# Patient Record
Sex: Male | Born: 1940 | Race: White | Hispanic: No | Marital: Married | Smoking: Former smoker
Health system: Southern US, Community
[De-identification: ages and names within clinical notes are randomized; demographics above are authoritative.]

## PROBLEM LIST (undated history)

## (undated) DIAGNOSIS — I00 Rheumatic fever without heart involvement: Secondary | ICD-10-CM

## (undated) DIAGNOSIS — E119 Type 2 diabetes mellitus without complications: Secondary | ICD-10-CM

## (undated) DIAGNOSIS — J449 Chronic obstructive pulmonary disease, unspecified: Secondary | ICD-10-CM

## (undated) DIAGNOSIS — I4891 Unspecified atrial fibrillation: Secondary | ICD-10-CM

## (undated) DIAGNOSIS — I10 Essential (primary) hypertension: Secondary | ICD-10-CM

## (undated) HISTORY — PX: JOINT REPLACEMENT: SHX530

---

## 2015-10-28 ENCOUNTER — Emergency Department (HOSPITAL_COMMUNITY): Payer: Medicare Other

## 2015-10-28 ENCOUNTER — Inpatient Hospital Stay (HOSPITAL_COMMUNITY): Payer: Medicare Other

## 2015-10-28 ENCOUNTER — Encounter (HOSPITAL_COMMUNITY): Payer: Self-pay

## 2015-10-28 ENCOUNTER — Inpatient Hospital Stay (HOSPITAL_COMMUNITY)
Admission: EM | Admit: 2015-10-28 | Discharge: 2015-11-01 | DRG: 184 | Disposition: A | Discharge status: Skilled Nursing Facility | Payer: Medicare Other | Attending: Surgery | Admitting: Surgery

## 2015-10-28 DIAGNOSIS — K5900 Constipation, unspecified: Secondary | ICD-10-CM | POA: Diagnosis present

## 2015-10-28 DIAGNOSIS — Z7901 Long term (current) use of anticoagulants: Secondary | ICD-10-CM

## 2015-10-28 DIAGNOSIS — R402143 Coma scale, eyes open, spontaneous, at hospital admission: Secondary | ICD-10-CM | POA: Diagnosis present

## 2015-10-28 DIAGNOSIS — E871 Hypo-osmolality and hyponatremia: Secondary | ICD-10-CM | POA: Diagnosis present

## 2015-10-28 DIAGNOSIS — I4891 Unspecified atrial fibrillation: Secondary | ICD-10-CM | POA: Diagnosis present

## 2015-10-28 DIAGNOSIS — Z6841 Body Mass Index (BMI) 40.0 and over, adult: Secondary | ICD-10-CM | POA: Diagnosis not present

## 2015-10-28 DIAGNOSIS — M21371 Foot drop, right foot: Secondary | ICD-10-CM | POA: Diagnosis present

## 2015-10-28 DIAGNOSIS — R140 Abdominal distension (gaseous): Secondary | ICD-10-CM | POA: Diagnosis present

## 2015-10-28 DIAGNOSIS — J449 Chronic obstructive pulmonary disease, unspecified: Secondary | ICD-10-CM | POA: Diagnosis present

## 2015-10-28 DIAGNOSIS — S2241XA Multiple fractures of ribs, right side, initial encounter for closed fracture: Secondary | ICD-10-CM | POA: Diagnosis present

## 2015-10-28 DIAGNOSIS — Y9301 Activity, walking, marching and hiking: Secondary | ICD-10-CM | POA: Diagnosis present

## 2015-10-28 DIAGNOSIS — Z96653 Presence of artificial knee joint, bilateral: Secondary | ICD-10-CM | POA: Diagnosis present

## 2015-10-28 DIAGNOSIS — I10 Essential (primary) hypertension: Secondary | ICD-10-CM | POA: Diagnosis present

## 2015-10-28 DIAGNOSIS — E669 Obesity, unspecified: Secondary | ICD-10-CM | POA: Diagnosis present

## 2015-10-28 DIAGNOSIS — Z880 Allergy status to penicillin: Secondary | ICD-10-CM | POA: Diagnosis not present

## 2015-10-28 DIAGNOSIS — W010XXA Fall on same level from slipping, tripping and stumbling without subsequent striking against object, initial encounter: Secondary | ICD-10-CM | POA: Diagnosis present

## 2015-10-28 DIAGNOSIS — R402253 Coma scale, best verbal response, oriented, at hospital admission: Secondary | ICD-10-CM | POA: Diagnosis present

## 2015-10-28 DIAGNOSIS — Z87891 Personal history of nicotine dependence: Secondary | ICD-10-CM | POA: Diagnosis not present

## 2015-10-28 DIAGNOSIS — R402363 Coma scale, best motor response, obeys commands, at hospital admission: Secondary | ICD-10-CM | POA: Diagnosis present

## 2015-10-28 DIAGNOSIS — S272XXA Traumatic hemopneumothorax, initial encounter: Secondary | ICD-10-CM

## 2015-10-28 DIAGNOSIS — W19XXXA Unspecified fall, initial encounter: Secondary | ICD-10-CM

## 2015-10-28 DIAGNOSIS — Y92480 Sidewalk as the place of occurrence of the external cause: Secondary | ICD-10-CM | POA: Diagnosis not present

## 2015-10-28 DIAGNOSIS — E119 Type 2 diabetes mellitus without complications: Secondary | ICD-10-CM | POA: Diagnosis present

## 2015-10-28 HISTORY — DX: Essential (primary) hypertension: I10

## 2015-10-28 HISTORY — DX: Type 2 diabetes mellitus without complications: E11.9

## 2015-10-28 HISTORY — DX: Chronic obstructive pulmonary disease, unspecified: J44.9

## 2015-10-28 HISTORY — DX: Unspecified atrial fibrillation: I48.91

## 2015-10-28 HISTORY — DX: Rheumatic fever without heart involvement: I00

## 2015-10-28 LAB — CBC WITH DIFFERENTIAL/PLATELET
BASOS ABS: 0 10*3/uL (ref 0.0–0.1)
BASOS PCT: 0 %
EOS PCT: 0 %
Eosinophils Absolute: 0 10*3/uL (ref 0.0–0.7)
HCT: 46.8 % (ref 39.0–52.0)
Hemoglobin: 15.3 g/dL (ref 13.0–17.0)
Lymphocytes Relative: 8 %
Lymphs Abs: 1 10*3/uL (ref 0.7–4.0)
MCH: 29.9 pg (ref 26.0–34.0)
MCHC: 32.7 g/dL (ref 30.0–36.0)
MCV: 91.6 fL (ref 78.0–100.0)
MONO ABS: 0.9 10*3/uL (ref 0.1–1.0)
Monocytes Relative: 8 %
Neutro Abs: 10.4 10*3/uL — ABNORMAL HIGH (ref 1.7–7.7)
Neutrophils Relative %: 84 %
PLATELETS: 206 10*3/uL (ref 150–400)
RBC: 5.11 MIL/uL (ref 4.22–5.81)
RDW: 13.7 % (ref 11.5–15.5)
WBC: 12.3 10*3/uL — AB (ref 4.0–10.5)

## 2015-10-28 LAB — BASIC METABOLIC PANEL
Anion gap: 8 (ref 5–15)
BUN: 17 mg/dL (ref 6–20)
CHLORIDE: 96 mmol/L — AB (ref 101–111)
CO2: 29 mmol/L (ref 22–32)
Calcium: 9.3 mg/dL (ref 8.9–10.3)
Creatinine, Ser: 0.88 mg/dL (ref 0.61–1.24)
GFR calc Af Amer: >60 mL/min (ref >60)
GFR calc non Af Amer: >60 mL/min (ref >60)
GLUCOSE: 249 mg/dL — AB (ref 65–99)
POTASSIUM: 4.9 mmol/L (ref 3.5–5.1)
SODIUM: 133 mmol/L — AB (ref 135–145)

## 2015-10-28 LAB — TYPE AND SCREEN
ABO/RH(D): B POS
Antibody Screen: NEGATIVE

## 2015-10-28 LAB — GLUCOSE, CAPILLARY: Glucose-Capillary: 196 mg/dL — ABNORMAL HIGH (ref 65–99)

## 2015-10-28 LAB — PROTIME-INR
INR: 1.91
Prothrombin Time: 22.1 seconds — ABNORMAL HIGH (ref 11.4–15.2)

## 2015-10-28 LAB — ABO/RH: ABO/RH(D): B POS

## 2015-10-28 MED ORDER — DOCUSATE SODIUM 100 MG PO CAPS
100.0000 mg | ORAL_CAPSULE | Freq: Two times a day (BID) | ORAL | Status: DC
  Administered 2015-10-28 – 2015-11-01 (×7): 100 mg via ORAL
  Filled 2015-10-28 (×7): qty 1

## 2015-10-28 MED ORDER — GLIMEPIRIDE 4 MG PO TABS
4.0000 mg | ORAL_TABLET | Freq: Every day | ORAL | Status: DC
  Administered 2015-10-29 – 2015-11-01 (×4): 4 mg via ORAL
  Filled 2015-10-28 (×4): qty 1

## 2015-10-28 MED ORDER — METFORMIN HCL ER 500 MG PO TB24
1000.0000 mg | ORAL_TABLET | Freq: Two times a day (BID) | ORAL | Status: DC
  Filled 2015-10-28: qty 2

## 2015-10-28 MED ORDER — NAPROXEN 250 MG PO TABS
500.0000 mg | ORAL_TABLET | Freq: Two times a day (BID) | ORAL | Status: DC
  Administered 2015-10-29 – 2015-11-01 (×7): 500 mg via ORAL
  Filled 2015-10-28 (×7): qty 2

## 2015-10-28 MED ORDER — POTASSIUM CHLORIDE IN NACL 20-0.45 MEQ/L-% IV SOLN
INTRAVENOUS | Status: DC
  Administered 2015-10-28: 22:00:00 via INTRAVENOUS
  Administered 2015-10-30: 1 mL via INTRAVENOUS
  Filled 2015-10-28 (×3): qty 1000

## 2015-10-28 MED ORDER — INSULIN ASPART 100 UNIT/ML ~~LOC~~ SOLN
0.0000 [IU] | Freq: Every day | SUBCUTANEOUS | Status: DC
  Administered 2015-10-29: 2 [IU] via SUBCUTANEOUS

## 2015-10-28 MED ORDER — MORPHINE SULFATE (PF) 2 MG/ML IV SOLN
2.0000 mg | INTRAVENOUS | Status: DC | PRN
  Administered 2015-10-28: 2 mg via INTRAVENOUS
  Filled 2015-10-28 (×2): qty 1

## 2015-10-28 MED ORDER — ATORVASTATIN CALCIUM 20 MG PO TABS
20.0000 mg | ORAL_TABLET | Freq: Every day | ORAL | Status: DC
  Administered 2015-10-29 – 2015-10-31 (×3): 20 mg via ORAL
  Filled 2015-10-28 (×4): qty 1

## 2015-10-28 MED ORDER — IOPAMIDOL (ISOVUE-300) INJECTION 61%
INTRAVENOUS | Status: AC
  Administered 2015-10-28: 100 mL
  Filled 2015-10-28: qty 100

## 2015-10-28 MED ORDER — TRAMADOL HCL 50 MG PO TABS
50.0000 mg | ORAL_TABLET | Freq: Four times a day (QID) | ORAL | Status: DC | PRN
  Administered 2015-10-28 – 2015-11-01 (×10): 100 mg via ORAL
  Filled 2015-10-28 (×10): qty 2

## 2015-10-28 MED ORDER — MORPHINE SULFATE (PF) 4 MG/ML IV SOLN
4.0000 mg | Freq: Once | INTRAVENOUS | Status: AC
  Administered 2015-10-28: 4 mg via INTRAVENOUS
  Filled 2015-10-28: qty 1

## 2015-10-28 MED ORDER — DILTIAZEM HCL ER COATED BEADS 240 MG PO CP24
240.0000 mg | ORAL_CAPSULE | Freq: Every day | ORAL | Status: DC
  Administered 2015-10-29 – 2015-11-01 (×4): 240 mg via ORAL
  Filled 2015-10-28: qty 2
  Filled 2015-10-28 (×3): qty 1

## 2015-10-28 MED ORDER — SIMVASTATIN 40 MG PO TABS: 40.0000 mg | ORAL_TABLET | Freq: Every day | ORAL | Status: DC

## 2015-10-28 MED ORDER — BENAZEPRIL HCL 40 MG PO TABS
40.0000 mg | ORAL_TABLET | Freq: Every day | ORAL | Status: DC
  Administered 2015-10-29 – 2015-11-01 (×4): 40 mg via ORAL
  Filled 2015-10-28 (×2): qty 1
  Filled 2015-10-28: qty 4
  Filled 2015-10-28: qty 1

## 2015-10-28 MED ORDER — INSULIN ASPART 100 UNIT/ML ~~LOC~~ SOLN
0.0000 [IU] | Freq: Three times a day (TID) | SUBCUTANEOUS | Status: DC
  Administered 2015-10-29 (×3): 4 [IU] via SUBCUTANEOUS
  Administered 2015-10-30: 7 [IU] via SUBCUTANEOUS
  Administered 2015-10-30: 11 [IU] via SUBCUTANEOUS
  Administered 2015-10-30: 3 [IU] via SUBCUTANEOUS
  Administered 2015-10-31: 11 [IU] via SUBCUTANEOUS
  Administered 2015-10-31: 3 [IU] via SUBCUTANEOUS
  Administered 2015-10-31: 7 [IU] via SUBCUTANEOUS

## 2015-10-28 MED ORDER — MORPHINE SULFATE (PF) 2 MG/ML IV SOLN
2.0000 mg | INTRAVENOUS | Status: DC | PRN
  Administered 2015-10-28 – 2015-10-29 (×5): 4 mg via INTRAVENOUS
  Administered 2015-10-31 (×2): 2 mg via INTRAVENOUS
  Administered 2015-10-31: 4 mg via INTRAVENOUS
  Filled 2015-10-28 (×3): qty 2
  Filled 2015-10-28: qty 1
  Filled 2015-10-28 (×3): qty 2
  Filled 2015-10-28: qty 1
  Filled 2015-10-28 (×2): qty 2

## 2015-10-28 MED ORDER — ONDANSETRON HCL 4 MG PO TABS: 4.0000 mg | ORAL_TABLET | Freq: Four times a day (QID) | ORAL | Status: DC | PRN

## 2015-10-28 MED ORDER — ONDANSETRON HCL 4 MG/2ML IJ SOLN
4.0000 mg | Freq: Four times a day (QID) | INTRAMUSCULAR | Status: DC | PRN
Start: 2015-10-28 — End: 2015-11-01

## 2015-10-28 MED ORDER — HYDROCHLOROTHIAZIDE 25 MG PO TABS
25.0000 mg | ORAL_TABLET | Freq: Every day | ORAL | Status: DC
  Administered 2015-10-29 – 2015-11-01 (×4): 25 mg via ORAL
  Filled 2015-10-28 (×4): qty 1

## 2015-10-28 MED ORDER — DULAGLUTIDE 1.5 MG/0.5ML ~~LOC~~ SOAJ: 0.5000 mL | SUBCUTANEOUS | Status: DC

## 2015-10-28 NOTE — ED Provider Notes (Signed)
  [facility] Provider Note   CSN: [id number] Arrival date & time: 10/28/15  1303     History   Chief Complaint No chief complaint on file.   HPI Derek Curtis is a 75 y.o. male.  HPI  No past medical history on file.  There are no active problems to display for this patient.   No past surgical history on file.     Home Medications    Prior to Admission medications   Not on File    Family History No family history on file.  Social History Social History  Substance Use Topics  . Smoking status: Not on file  . Smokeless tobacco: Not on file  . Alcohol use Not on file     Allergies   Review of patient's allergies indicates not on file.   Review of Systems Review of Systems   Physical Exam Updated Vital Signs Pulse 98   Temp 98 F (36.7 C)   Resp 17   Ht 6\' 1"  (1.854 m)   Wt (!) 327 lb (148.3 kg)   SpO2 94%   BMI 43.14 kg/m   Physical Exam   ED Treatments / Results  Labs (all labs ordered are listed, but only abnormal results are displayed) Labs Reviewed  BASIC METABOLIC PANEL  CBC WITH DIFFERENTIAL/PLATELET  PROTIME-INR    EKG  EKG Interpretation  Date/Time:  Wednesday October 28 2015 13:35:54 EDT Ventricular Rate:  115 PR Interval:    QRS Duration: 159 QT Interval:  369 QTC Calculation: 511 R Axis:   -65 Text Interpretation:  Atrial fibrillation RBBB and LAFB No priors for comparison Confirmed by Trust Crago  MD, Tariq Pernell ([id number]) on 10/28/2015 1:45:53 PM       Radiology No results found.  Procedures Procedures (including critical care time)  Medications Ordered in ED Medications - No data to display   Initial Impression / Assessment and Plan / ED Course  I have reviewed the triage vital signs and the nursing notes.  Pertinent labs & imaging results that were available during my care of the patient were reviewed by me and considered in my medical decision making (see chart for details).  Clinical Course    Patient  is a 75 year old male with past medical history of atrial fibrillation on Coumadin. He presents for evaluation of a fall. He was seen at his primary doctor's office and referred here for possible pneumothorax/hemothorax. Due to his anticoagulated state, I elected to obtain a CT scan of the chest, abdomen, and pelvis. This revealed multiple right-sided rib fractures with no evidence for pneumothorax or hemothorax. There is no evidence for intra-abdominal injury. The patient is experiencing significant discomfort and I feel as though admission for pain control is appropriate. I've spoken with the PA from trauma surgery who will evaluate and admit the patient.  Final Clinical Impressions(s) / ED Diagnoses   Final diagnoses:  None    New Prescriptions New Prescriptions   No medications on file     Geoffery Lyonsouglas Gerome Kokesh, MD 10/29/15 [phone]

## 2015-10-28 NOTE — ED Triage Notes (Addendum)
Pt arrives POV from Urgent Care with c/o fall at 1000 and was told at urgent care his lungs have fluid and blood in them,. Pt c/o pain at right rib And shortness of breath.becomes tachypnic with move from wheel chair to stretcher. States uses CPAP at night. Sat 89 on RA but 96 with 3 l/Many. Pt staes he was given anti inflammitory shot and anti nausea shot at urgent care.

## 2015-10-28 NOTE — H&P (Signed)
Derek Curtis is an 75 y.o. male.   Chief Complaint: Fall HPI: Derek Curtis was walking when he tripped on the sidewalk and fell onto his right side. 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 Had immediate back pain. Went to urgent care where he was told to come to the ED. He c/o back pain and pain with inspiration but not really SOB.  Past Medical History:  Diagnosis Date  . Atrial fibrillation ([place])   . COPD (chronic obstructive pulmonary disease) ([place])   . Diabetes mellitus without complication ([place])   . Hypertension   . Rheumatic fever     Past Surgical History:  Procedure Laterality Date  . JOINT REPLACEMENT     bilateral knee\    No family history on file. Social History:  reports that he quit smoking about 25 years ago. He quit after 0.00 years of use. He has never used smokeless tobacco. He reports that he does not drink alcohol. His drug history is not on file.  Allergies:  Allergies  Allergen Reactions  . Penicillins Rash    Has patient had a PCN reaction causing immediate rash, facial/tongue/throat swelling, SOB or lightheadedness with hypotension: Yes Has patient had a PCN reaction causing severe rash involving mucus membranes or skin necrosis: No Has patient had a PCN reaction that required hospitalization No Has patient had a PCN reaction occurring within the last 10 years: No If all of the above answers are "NO", then may proceed with Cephalosporin use.     Results for orders placed or performed during the hospital encounter of 10/28/15 (from the past 48 hour(s))  Basic metabolic panel     Status: Abnormal   Collection Time: 10/28/15  1:40 PM  Result Value Ref Range   Sodium 133 (L) 135 - 145 mmol/L   Potassium 4.9 3.5 - 5.1 mmol/L   Chloride 96 (L) 101 - 111 mmol/L   CO2 29 22 - 32 mmol/L   Glucose, Bld 249 (H) 65 - 99 mg/dL   BUN 17 6 - 20 mg/dL   Creatinine, Ser 0.88 0.61 - 1.24 mg/dL   Calcium 9.3 8.9 - 10.3 mg/dL   GFR calc non Af  Amer >60 >60 mL/min   GFR calc Af Amer >60 >60 mL/min    Comment: (NOTE) The eGFR has been calculated using the CKD EPI equation. This calculation has not been validated in all clinical situations. eGFR's persistently <60 mL/min signify possible Chronic Kidney Disease.    Anion gap 8 5 - 15  CBC with Differential     Status: Abnormal   Collection Time: 10/28/15  1:40 PM  Result Value Ref Range   WBC 12.3 (H) 4.0 - 10.5 K/uL   RBC 5.11 4.22 - 5.81 MIL/uL   Hemoglobin 15.3 13.0 - 17.0 g/dL   HCT 46.8 39.0 - 52.0 %   MCV 91.6 78.0 - 100.0 fL   MCH 29.9 26.0 - 34.0 pg   MCHC 32.7 30.0 - 36.0 g/dL   RDW 13.7 11.5 - 15.5 %   Platelets 206 150 - 400 K/uL   Neutrophils Relative % 84 %   Neutro Abs 10.4 (H) 1.7 - 7.7 K/uL   Lymphocytes Relative 8 %   Lymphs Abs 1.0 0.7 - 4.0 K/uL   Monocytes Relative 8 %   Monocytes Absolute 0.9 0.1 - 1.0 K/uL   Eosinophils Relative 0 %   Eosinophils Absolute 0.0 0.0 - 0.7 K/uL   Basophils Relative 0 %  Basophils Absolute 0.0 0.0 - 0.1 K/uL  Protime-INR     Status: Abnormal   Collection Time: 10/28/15  1:40 PM  Result Value Ref Range   Prothrombin Time 22.1 (H) 11.4 - 15.2 seconds   INR 1.91    Ct Chest W Contrast  Result Date: 10/28/2015 CLINICAL DATA:  Chest wall pain after falling while walking. Hypertension. COPD. Diabetes. Atrial fibrillation. EXAM: CT CHEST, ABDOMEN, AND PELVIS WITH CONTRAST TECHNIQUE: Multidetector CT imaging of the chest, abdomen and pelvis was performed following the standard protocol during bolus administration of intravenous contrast. CONTRAST:  145m ISOVUE-300 IOPAMIDOL (ISOVUE-300) INJECTION 61% COMPARISON:  Chest radiograph of 10/28/2015. FINDINGS: CT CHEST FINDINGS Cardiovascular: Aortic and branch vessel atherosclerosis. Mild cardiomegaly, without pericardial effusion. Multivessel coronary artery atherosclerosis. Pulmonary artery enlargement, 3.7 cm outflow tract. Mediastinum/Nodes: No mediastinal or hilar adenopathy.  Lungs/Pleura: Trace right-sided pleural thickening. No pneumothorax. Clear lungs. Musculoskeletal: Minimally displaced fractures of the posterior fourth through eighth ribs. CT ABDOMEN PELVIS FINDINGS Hepatobiliary: Nonspecific caudate lobe enlargement. No focal liver lesion. Normal gallbladder, without biliary ductal dilatation. Pancreas: Normal, without mass or ductal dilatation. Spleen: Normal in size, without focal abnormality. Adrenals/Urinary Tract: Normal adrenal glands. Posterior interpolar right renal 4.5 cm lesion demonstrates minimal complexity within, most consistent with a minimally complex cyst. Example image 64/series 2. More inferior interpolar right renal lesion is fluid density, most consistent with a cyst. There also too small to characterize lesions in both kidneys No hydronephrosis. Normal urinary bladder. Stomach/Bowel: Normal stomach, without wall thickening. Scattered colonic diverticula. Normal small bowel. Vascular/Lymphatic: Advanced aortic and branch vessel atherosclerosis. No abdominopelvic adenopathy. Reproductive: Normal prostate. Other: No significant free fluid.  No free intraperitoneal air. Musculoskeletal: No acute osseous abnormality. Thoracolumbar spondylosis, with degenerate disc disease at L4-5. IMPRESSION: 1. Right fourth through eighth posterior rib fractures, acute. Minimal overlying right-sided pleural thickening. No pneumothorax. 2. No other posttraumatic deformity identified. 3. Mild  degradation secondary to patient body habitus. 4.  Coronary artery atherosclerosis. Aortic atherosclerosis. 5. Pulmonary artery enlargement suggests pulmonary arterial hypertension. Electronically Signed   By: KAbigail MiyamotoM.D.   On: 10/28/2015 15:10   Ct Abdomen Pelvis W Contrast  Result Date: 10/28/2015 CLINICAL DATA:  Chest wall pain after falling while walking. Hypertension. COPD. Diabetes. Atrial fibrillation. EXAM: CT CHEST, ABDOMEN, AND PELVIS WITH CONTRAST TECHNIQUE:  Multidetector CT imaging of the chest, abdomen and pelvis was performed following the standard protocol during bolus administration of intravenous contrast. CONTRAST:  1031mISOVUE-300 IOPAMIDOL (ISOVUE-300) INJECTION 61% COMPARISON:  Chest radiograph of 10/28/2015. FINDINGS: CT CHEST FINDINGS Cardiovascular: Aortic and branch vessel atherosclerosis. Mild cardiomegaly, without pericardial effusion. Multivessel coronary artery atherosclerosis. Pulmonary artery enlargement, 3.7 cm outflow tract. Mediastinum/Nodes: No mediastinal or hilar adenopathy. Lungs/Pleura: Trace right-sided pleural thickening. No pneumothorax. Clear lungs. Musculoskeletal: Minimally displaced fractures of the posterior fourth through eighth ribs. CT ABDOMEN PELVIS FINDINGS Hepatobiliary: Nonspecific caudate lobe enlargement. No focal liver lesion. Normal gallbladder, without biliary ductal dilatation. Pancreas: Normal, without mass or ductal dilatation. Spleen: Normal in size, without focal abnormality. Adrenals/Urinary Tract: Normal adrenal glands. Posterior interpolar right renal 4.5 cm lesion demonstrates minimal complexity within, most consistent with a minimally complex cyst. Example image 64/series 2. More inferior interpolar right renal lesion is fluid density, most consistent with a cyst. There also too small to characterize lesions in both kidneys No hydronephrosis. Normal urinary bladder. Stomach/Bowel: Normal stomach, without wall thickening. Scattered colonic diverticula. Normal small bowel. Vascular/Lymphatic: Advanced aortic and branch vessel atherosclerosis. No abdominopelvic adenopathy. Reproductive: Normal prostate.  Other: No significant free fluid.  No free intraperitoneal air. Musculoskeletal: No acute osseous abnormality. Thoracolumbar spondylosis, with degenerate disc disease at L4-5. IMPRESSION: 1. Right fourth through eighth posterior rib fractures, acute. Minimal overlying right-sided pleural thickening. No pneumothorax.  2. No other posttraumatic deformity identified. 3. Mild  degradation secondary to patient body habitus. 4.  Coronary artery atherosclerosis. Aortic atherosclerosis. 5. Pulmonary artery enlargement suggests pulmonary arterial hypertension. Electronically Signed   By: Abigail Miyamoto M.D.   On: 10/28/2015 15:10   Dg Chest Portable 1 View  Result Date: 10/28/2015 CLINICAL DATA:  Right posterior rib pain.  Shortness of breath. EXAM: PORTABLE CHEST 1 VIEW COMPARISON:  None. FINDINGS: 1336 hours. The left costophrenic angle is partly excluded from this portable examination. The heart is enlarged. There is aortic atherosclerosis. Prominent interstitial markings are present in both perihilar regions, suspicious for mild edema. There is no confluent airspace opacity, pleural effusion or pneumothorax. No osseous abnormalities are seen. IMPRESSION: Cardiomegaly with mild pulmonary edema suspicious for congestive heart failure. No apparent osseous abnormalities. Electronically Signed   By: Richardean Sale M.D.   On: 10/28/2015 13:47    Review of Systems  Constitutional: Negative for weight loss.  HENT: Negative for ear discharge, ear pain, hearing loss and tinnitus.   Eyes: Negative for blurred vision, double vision, photophobia and pain.  Respiratory: Negative for cough, sputum production and shortness of breath.   Cardiovascular: Negative for chest pain.  Gastrointestinal: Negative for abdominal pain, nausea and vomiting.  Genitourinary: Negative for dysuria, flank pain, frequency and urgency.  Musculoskeletal: Positive for back pain. Negative for falls, joint pain, myalgias and neck pain.  Neurological: Negative for dizziness, tingling, sensory change, focal weakness, loss of consciousness and headaches.  Endo/Heme/Allergies: Does not bruise/bleed easily.  Psychiatric/Behavioral: Negative for depression, memory loss and substance abuse. The patient is not nervous/anxious.     Blood pressure 152/86, pulse  112, temperature 98.2 F (36.8 C), temperature source Oral, resp. rate 22, height 6' 1"  (1.854 m), weight (!) 148.3 kg (327 lb), SpO2 94 %. Physical Exam  Vitals reviewed. Constitutional: He is oriented to person, place, and time. He appears well-developed and well-nourished. He is cooperative. No distress. Nasal cannula in place.  HENT:  Head: Normocephalic and atraumatic. Head is without raccoon's eyes, without Battle's sign, without abrasion, without contusion and without laceration.  Right Ear: Hearing and external ear normal. No drainage or tenderness.  Left Ear: Hearing, tympanic membrane, external ear and ear canal normal. No lacerations. No drainage or tenderness. No foreign bodies. Tympanic membrane is not perforated. No hemotympanum.  Nose: Nose normal. No nose lacerations, sinus tenderness, nasal deformity or nasal septal hematoma. No epistaxis.  Mouth/Throat: Uvula is midline, oropharynx is clear and moist and mucous membranes are normal. No lacerations. No oropharyngeal exudate.  Eyes: Conjunctivae, EOM and lids are normal. Pupils are equal, round, and reactive to light. Right eye exhibits no discharge. Left eye exhibits no discharge. No scleral icterus.  Neck: Trachea normal and normal range of motion. Neck supple. No JVD present. No spinous process tenderness and no muscular tenderness present. Carotid bruit is not present. No tracheal deviation present. No thyromegaly present.  Cardiovascular: Normal rate, regular rhythm, normal heart sounds, intact distal pulses and normal pulses.  Exam reveals no gallop and no friction rub.   No murmur heard. Respiratory: Effort normal and breath sounds normal. No stridor. No respiratory distress. He has no wheezes. He has no rales. He exhibits no tenderness, no bony tenderness,  no laceration and no crepitus.  GI: Soft. Normal appearance and bowel sounds are normal. He exhibits no distension. There is no tenderness. There is no rigidity, no rebound,  no guarding and no CVA tenderness.  Musculoskeletal: Normal range of motion. He exhibits no edema.       Thoracic back: He exhibits tenderness.  Lymphadenopathy:    He has no cervical adenopathy.  Neurological: He is alert and oriented to person, place, and time. He has normal strength. No cranial nerve deficit or sensory deficit. GCS eye subscore is 4. GCS verbal subscore is 5. GCS motor subscore is 6.  Skin: Skin is warm, dry and intact. He is not diaphoretic.  Psychiatric: He has a normal mood and affect. His speech is normal and behavior is normal.     Assessment/Plan Fall Multiple right rib fxs -- Pulmonary toilet, watch closely for hemothorax given coumadin use (though slightly subtherapeutic). IS 741m Multiple medical problems -- Home meds except coumadin  Admit to trauma to SDU, check CXR this evening    MLisette Abu PA-C Pager: [phone] Trauma PA Pager: [phone]/30/2017, 3:53 PM

## 2015-10-28 NOTE — ED Provider Notes (Signed)
[facility] Provider Note   CSN: [id number] Arrival date & time: 10/28/15  1303     History   Chief Complaint No chief complaint on file.   HPI Aadarsh Lawerance BachBurns is a 75 y.o. male.  Patient is a 75 year old male with past history of diabetes, atrial fibrillation on Coumadin. He also has a history of a "drop foot" on the right. He reports walking this afternoon and losing his balance causing him to fall forward and strike his chest. This occurred at approximately 10 AM. Since that time he has been having worsening discomfort. He initially went to his primary doctor's office where a chest x-ray was obtained. This apparently revealed several broken ribs along with a hemothorax. He was then sent here for further evaluation. He does report ongoing discomfort to the right lateral ribs that is worse with breathing.   The history is provided by the patient.    No past medical history on file.  There are no active problems to display for this patient.   No past surgical history on file.     Home Medications    Prior to Admission medications   Not on File    Family History No family history on file.  Social History Social History  Substance Use Topics  . Smoking status: Not on file  . Smokeless tobacco: Not on file  . Alcohol use Not on file     Allergies   Review of patient's allergies indicates not on file.   Review of Systems Review of Systems  All other systems reviewed and are negative.    Physical Exam Updated Vital Signs Pulse 98   Temp 98 F (36.7 C)   Resp 17   Ht 6\' 1"  (1.854 m)   Wt (!) 327 lb (148.3 kg)   SpO2 94%   BMI 43.14 kg/m   Physical Exam  Constitutional: He is oriented to person, place, and time. He appears well-developed and well-nourished. No distress.  HENT:  Head: Normocephalic and atraumatic.  Mouth/Throat: Oropharynx is clear and moist.  Neck: Normal range of motion. Neck supple.  Cardiovascular: Normal rate and  regular rhythm.  Exam reveals no friction rub.   No murmur heard. Pulmonary/Chest: Effort normal and breath sounds normal. No respiratory distress. He has no wheezes. He has no rales.  There is tenderness to palpation of the right upper lateral ribs. There is no crepitus or deformity noted.  Abdominal: Soft. Bowel sounds are normal. He exhibits no distension. There is no tenderness.  Musculoskeletal: Normal range of motion. He exhibits no edema.  Neurological: He is alert and oriented to person, place, and time. Coordination normal.  Skin: Skin is warm and dry. He is not diaphoretic.  Nursing note and vitals reviewed.    ED Treatments / Results  Labs (all labs ordered are listed, but only abnormal results are displayed) Labs Reviewed  BASIC METABOLIC PANEL  CBC WITH DIFFERENTIAL/PLATELET  PROTIME-INR    EKG  EKG Interpretation  Date/Time:  Wednesday October 28 2015 13:35:54 EDT Ventricular Rate:  115 PR Interval:    QRS Duration: 159 QT Interval:  369 QTC Calculation: 511 R Axis:   -65 Text Interpretation:  Atrial fibrillation RBBB and LAFB No priors for comparison Confirmed by Madoline Bhatt  MD, Cammie Faulstich ([id number]) on 10/28/2015 1:45:53 PM       Radiology No results found.  Procedures Procedures (including critical care time)  Medications Ordered in ED Medications - No data to display   Initial Impression /  Assessment and Plan / ED Course  I have reviewed the triage vital signs and the nursing notes.  Pertinent labs & imaging results that were available during my care of the patient were reviewed by me and considered in my medical decision making (see chart for details).  Clinical Course    Portable Chest x-ray reveals no obvious pneumothorax or hemothorax. Due to the fact he is on Coumadin, CT scans of the chest, abdomen, and pelvis were obtained. This revealed multiple right-sided rib fractures, however no pneumothorax or hemothorax. There is also no evidence for  intra-abdominal injury. Patient has required repeat doses of morphine and I feel as though admission for pain control and observation is indicated. I've spoken with the PA from trauma surgery who will evaluate patient and initiate the admission procedure.  Final Clinical Impressions(s) / ED Diagnoses   Final diagnoses:  None    New Prescriptions New Prescriptions   No medications on file     Geoffery Lyons, MD 10/28/15 1536

## 2015-10-28 NOTE — ED Notes (Signed)
Attempted to call report

## 2015-10-29 ENCOUNTER — Inpatient Hospital Stay (HOSPITAL_COMMUNITY): Payer: Medicare Other

## 2015-10-29 LAB — CBC
HEMATOCRIT: 46.1 % (ref 39.0–52.0)
Hemoglobin: 14.8 g/dL (ref 13.0–17.0)
MCH: 29.6 pg (ref 26.0–34.0)
MCHC: 32.1 g/dL (ref 30.0–36.0)
MCV: 92.2 fL (ref 78.0–100.0)
PLATELETS: 210 10*3/uL (ref 150–400)
RBC: 5 MIL/uL (ref 4.22–5.81)
RDW: 14.1 % (ref 11.5–15.5)
WBC: 11.6 10*3/uL — AB (ref 4.0–10.5)

## 2015-10-29 LAB — BASIC METABOLIC PANEL
ANION GAP: 9 (ref 5–15)
BUN: 21 mg/dL — ABNORMAL HIGH (ref 6–20)
CALCIUM: 9.1 mg/dL (ref 8.9–10.3)
CO2: 28 mmol/L (ref 22–32)
Chloride: 94 mmol/L — ABNORMAL LOW (ref 101–111)
Creatinine, Ser: 0.87 mg/dL (ref 0.61–1.24)
Glucose, Bld: 197 mg/dL — ABNORMAL HIGH (ref 65–99)
POTASSIUM: 5.1 mmol/L (ref 3.5–5.1)
Sodium: 131 mmol/L — ABNORMAL LOW (ref 135–145)

## 2015-10-29 LAB — GLUCOSE, CAPILLARY
GLUCOSE-CAPILLARY: 183 mg/dL — AB (ref 65–99)
Glucose-Capillary: 197 mg/dL — ABNORMAL HIGH (ref 65–99)
Glucose-Capillary: 183 mg/dL — ABNORMAL HIGH (ref 65–99)
Glucose-Capillary: 221 mg/dL — ABNORMAL HIGH (ref 65–99)

## 2015-10-29 LAB — PROTIME-INR
INR: 1.68
Prothrombin Time: 19.9 seconds — ABNORMAL HIGH (ref 11.4–15.2)

## 2015-10-29 MED ORDER — INSULIN DETEMIR 100 UNIT/ML ~~LOC~~ SOLN
36.0000 [IU] | Freq: Every day | SUBCUTANEOUS | Status: DC
  Administered 2015-10-29 – 2015-10-31 (×3): 36 [IU] via SUBCUTANEOUS
  Filled 2015-10-29 (×4): qty 0.36

## 2015-10-29 MED ORDER — METHOCARBAMOL 500 MG PO TABS
1000.0000 mg | ORAL_TABLET | Freq: Three times a day (TID) | ORAL | Status: DC | PRN
  Administered 2015-10-29 – 2015-10-31 (×5): 1000 mg via ORAL
  Filled 2015-10-29 (×5): qty 2

## 2015-10-29 NOTE — Evaluation (Signed)
Physical Therapy Evaluation Patient Details Name: Derek Curtis MRN: [id number] DOB: 04/10/1940 Today's Date: 10/29/2015   History of Present Illness  Pt is a 75 y.o. male admitted following a fall in which he sustained multi right side rib fxs with questionable hemothorax.  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  Clinical Impression  Pt admitted with above diagnosis. Pt currently with functional limitations due to the deficits listed below (see PT Problem List). On eval, pt required +2 mod assist for transfers and mod assist +2 safety for ambulation 3 feet with RW. Gait distance limited by pain. Pt will benefit from skilled PT to increase their independence and safety with mobility to allow discharge to the venue listed below.  At current mobility level, pt's wife will be unable to provide needed assist at home. If mobility status improves to min guard assist level or better, pt would be able to d/c home with HHPT instead of SNF. Pt resides in [place].     Follow Up Recommendations SNF;Supervision/Assistance - 24 hour    Equipment Recommendations  Rolling walker with 5" wheels    Recommendations for Other Services       Precautions / Restrictions Precautions Precautions: Fall Restrictions Weight Bearing Restrictions: No      Mobility  Bed Mobility               General bed mobility comments: Pt up in recliner.  Transfers Overall transfer level: Needs assistance Equipment used: Rolling walker (2 wheeled) Transfers: Sit to/from Stand Sit to Stand: +2 physical assistance;Mod assist         General transfer comment: Verbal cues needed to keep pt from holding his breath. Assist to power up.  Ambulation/Gait Ambulation/Gait assistance: +2 safety/equipment;Mod assist Ambulation Distance (Feet): 3 Feet Assistive device: Rolling walker (2 wheeled) Gait Pattern/deviations: Decreased stride length;Antalgic;Step-through  pattern;Trunk flexed;Shuffle Gait velocity: decreased Gait velocity interpretation: Below normal speed for age/gender General Gait Details: Short, shuffle steps. Ambulated on RA with sats decreasing into the 80s. Continuous verbal cues for breathing as pt tends to hold his breath. Heavy reliance on RW. Gait distance limited by pain.  Stairs            Wheelchair Mobility    Modified Rankin (Stroke Patients Only)       Balance Overall balance assessment: Needs assistance;History of Falls Sitting-balance support: Feet supported;No upper extremity supported Sitting balance-Leahy Scale: Good     Standing balance support: Bilateral upper extremity supported;During functional activity Standing balance-Leahy Scale: Fair                               Pertinent Vitals/Pain Pain Assessment: 0-10 Pain Score: 7  Pain Location: back/scapular area Pain Descriptors / Indicators: Discomfort;Aching Pain Intervention(s): Limited activity within patient's tolerance;Relaxation;Premedicated before session    Home Living Family/patient expects to be discharged to:: Private residence Living Arrangements: Spouse/significant other Available Help at Discharge: Family Type of Home: House Home Access: Level entry     Home Layout: One level Home Equipment: Grab bars - tub/shower      Prior Function Level of Independence: Independent               Hand Dominance        Extremity/Trunk Assessment   Upper Extremity Assessment: Defer to OT evaluation           Lower Extremity Assessment:  RLE deficits/detail RLE Deficits / Details: foot drop, Pt reports having tried different splints in the past without success.    Cervical / Trunk Assessment: Normal  Communication   Communication: No difficulties  Cognition Arousal/Alertness: Awake/alert Behavior During Therapy: WFL for tasks assessed/performed Overall Cognitive Status: Within Functional Limits for tasks  assessed                      General Comments      Exercises        Assessment/Plan    PT Assessment Patient needs continued PT services  PT Diagnosis Difficulty walking;Acute pain   PT Problem List Decreased activity tolerance;Decreased balance;Decreased mobility;Pain;Decreased safety awareness;Decreased knowledge of use of DME  PT Treatment Interventions DME instruction;Gait training;Functional mobility training;Therapeutic activities;Therapeutic exercise;Balance training;Patient/family education   PT Goals (Current goals can be found in the Care Plan section) Acute Rehab PT Goals Patient Stated Goal: independence PT Goal Formulation: With patient Time For Goal Achievement: 11/12/15 Potential to Achieve Goals: Good    Frequency Min 5X/week   Barriers to discharge        Co-evaluation PT/OT/SLP Co-Evaluation/Treatment: Yes Reason for Co-Treatment: For patient/therapist safety PT goals addressed during session: Mobility/safety with mobility;Balance         End of Session Equipment Utilized During Treatment: Gait belt Activity Tolerance: Patient limited by pain Patient left: in chair;with family/visitor present;with call bell/phone within reach Nurse Communication: Mobility status         Time: 1610-9604 PT Time Calculation (min) (ACUTE ONLY): 27 min   Charges:   PT Evaluation $PT Eval Moderate Complexity: 1 Procedure     PT G CodesIlda Curtis 10/29/2015, 11:58 AM

## 2015-10-29 NOTE — Progress Notes (Signed)
Pt has not voided all shift, bladder scanned him @ 0400 and he had 189 at most recheck bladder at 0540 he had 225 said he did not have to pt did  try and voided 125

## 2015-10-29 NOTE — Plan of Care (Signed)
Problem: Safety: Goal: Ability to remain free from injury will improve Outcome: Progressing Instructed to call for assistance when needing to get OOB or use the bathroom.   Problem: Pain Managment: Goal: General experience of comfort will improve Outcome: Progressing PO Ultram and Robaxin appear to be more effective than IV Morphine at this time.   Problem: Activity: Goal: Risk for activity intolerance will decrease Outcome: Progressing PT/OT consulted and evaluated patient today.

## 2015-10-29 NOTE — NC FL2 (Signed)
[place] MEDICAID FL2 LEVEL OF CARE SCREENING TOOL     IDENTIFICATION  Patient Name: Derek Curtis Birthdate: [birth year] Sex: male Admission Date (Current Location): 10/28/2015  [place] and [place] Number:   Chari Manning)   Facility and Address:  [facility] [place], [address], [place], [place] [address]      Provider Number: [id number]  Attending Physician Name and Address:  Trauma Md, MD  Relative Name and Phone Number:       Current Level of Care: Hospital Recommended Level of Care: Skilled Nursing Facility Prior Approval Number:    Date Approved/Denied:   PASRR Number: [id number]  Discharge Plan: SNF    Current Diagnoses: Patient Active Problem List   Diagnosis Date Noted  . Multiple fractures of ribs of right side 10/28/2015    Orientation RESPIRATION BLADDER Height & Weight     Self, Time, Situation, Place  O2 (Nasal Canula 2 L) Continent Weight: (!) 326 lb 8 oz (148.1 kg) Height:  6\' 1"  (185.4 cm)  BEHAVIORAL SYMPTOMS/MOOD NEUROLOGICAL BOWEL NUTRITION STATUS   (None)  (None) Continent Diet (Carb modified)  AMBULATORY STATUS COMMUNICATION OF NEEDS Skin   Limited Assist Verbally Skin abrasions                       Personal Care Assistance Level of Assistance  Bathing, Feeding, Dressing Bathing Assistance: Maximum assistance Feeding assistance: Independent Dressing Assistance: Maximum assistance     Functional Limitations Info  Sight, Hearing, Speech Sight Info: Adequate Hearing Info: Adequate Speech Info: Adequate    SPECIAL CARE FACTORS FREQUENCY  PT (By licensed PT), OT (By licensed OT)     PT Frequency: 5 x week OT Frequency: 5 x week            Contractures Contractures Info: Not present    Additional Factors Info  Code Status, Allergies Code Status Info: Full Allergies Info: Penicillins           Current Medications (10/29/2015):  This is the current hospital active medication list Current  Facility-Administered Medications  Medication Dose Route Frequency Provider Last Rate Last Dose  . 0.45 % NaCl with KCl 20 mEq / L infusion   Intravenous Continuous Violeta Gelinas, MD   Stopped at 10/29/15 1200  . atorvastatin (LIPITOR) tablet 20 mg  20 mg Oral q1800 Manus Rudd, MD      . benazepril (LOTENSIN) tablet 40 mg  40 mg Oral Daily Manus Rudd, MD   40 mg at 10/29/15 0959  . diltiazem (CARDIZEM CD) 24 hr capsule 240 mg  240 mg Oral Daily Manus Rudd, MD   240 mg at 10/29/15 0959  . docusate sodium (COLACE) capsule 100 mg  100 mg Oral BID Freeman Caldron, PA-C   100 mg at 10/29/15 5621  . Dulaglutide SOPN 0.5 mL  0.5 mL Subcutaneous Weekly Manus Rudd, MD      . glimepiride (AMARYL) tablet 4 mg  4 mg Oral Daily Manus Rudd, MD   4 mg at 10/29/15 0959  . hydrochlorothiazide (HYDRODIURIL) tablet 25 mg  25 mg Oral Daily Manus Rudd, MD   25 mg at 10/29/15 0959  . insulin aspart (novoLOG) injection 0-20 Units  0-20 Units Subcutaneous TID WC Freeman Caldron, PA-C   4 Units at 10/29/15 1317  . insulin aspart (novoLOG) injection 0-5 Units  0-5 Units Subcutaneous QHS Freeman Caldron, PA-C      . insulin detemir (LEVEMIR) injection 36 Units  36 Units Subcutaneous QHS Freeman CaldronMichael J Jeffery, PA-C      . methocarbamol (ROBAXIN) tablet 1,000 mg  1,000 mg Oral Q8H PRN Violeta GelinasBurke Thompson, MD   1,000 mg at 10/29/15 1009  . morphine 2 MG/ML injection 2-4 mg  2-4 mg Intravenous Q2H PRN Manus RuddMatthew Tsuei, MD   4 mg at 10/29/15 0751  . naproxen (NAPROSYN) tablet 500 mg  500 mg Oral BID WC Freeman CaldronMichael J Jeffery, PA-C   500 mg at 10/29/15 0750  . ondansetron (ZOFRAN) tablet 4 mg  4 mg Oral Q6H PRN Freeman CaldronMichael J Jeffery, PA-C       Or  . ondansetron [facility](ZOFRAN) injection 4 mg  4 mg Intravenous Q6H PRN Freeman CaldronMichael J Jeffery, PA-C      . traMADol Janean Sark(ULTRAM) tablet 50-100 mg  50-100 mg Oral Q6H PRN Freeman CaldronMichael J Jeffery, PA-C   100 mg at 10/29/15 1008     Discharge Medications: Please see discharge summary for a list of  discharge medications.  Relevant Imaging Results:  Relevant Lab Results:   Additional Information SS#: [id number]  Margarito LinerSarah C Naylah Cork, LCSW

## 2015-10-29 NOTE — Progress Notes (Addendum)
Inpatient Diabetes Program Recommendations  AACE/ADA: New Consensus Statement on Inpatient Glycemic Control (2015)  Target Ranges:  Prepandial:   less than 140 mg/dL      Peak postprandial:   less than 180 mg/dL (1-2 hours)      Critically ill patients:  140 - 180 mg/dL   Lab Results  Component Value Date   GLUCAP 197 (H) 10/29/2015   Results for Derek Curtis, Derek Curtis (MRN [id number]) as of 10/29/2015 11:38  Ref. Range 10/28/2015 21:42 10/29/2015 08:11  Glucose-Capillary Latest Ref Range: 65 - 99 mg/dL 308196 (H) 657197 (H)    Diabetes history: Type 2 diabetes Outpatient Diabetes medications: Trulicity 1.5 mg weekly, Amaryl 4 mg daily, Levemir 36 units q HS, Metformin XR 1000 mg bid Current orders for Inpatient glycemic control:  Novolog resistant tid with meals and HS, Amaryl 4 mg daily, Dulaglutide .5 ml weekly  Inpatient Diabetes Program Recommendations:    Please restart patient's home dose of Levemir 36 units q HS. Called and discussed with PA, Earney HamburgMichael Jeffrey and orders received.   Thanks, Beryl MeagerJenny Dorris Pierre, RN, BC-ADM Inpatient Diabetes Coordinator Pager [phone] (8a-5p)

## 2015-10-29 NOTE — Progress Notes (Signed)
Patient had not voided all shift. At this time he stated he felt like he could void, so his wife assisted him with the urinal. Unfortunately, the lid was not removed from the urinal and he voided a VERY LARGE amount of urine onto the floor. He then stood up and voided another 125mls into the urinal. He did not appear to have any issue voiding, states he's never had an issue voiding, and said he felt like he "was just dehydrated" and that's why he didn't void earlier. Patient was not bladder scanned at this time.   Leanna BattlesEckelmann, Feras Gardella Eileen, RN.

## 2015-10-29 NOTE — Progress Notes (Signed)
Transferred from 3S to 6n15 at this time. Oriented to room and surroundings, denies nausea, c/o right rib area pain, family at bedside.

## 2015-10-29 NOTE — Progress Notes (Addendum)
Occupational Therapy Evaluation Patient Details Name: Derek Curtis MRN: [id number] DOB: 11/19/1940 Today's Date: 10/29/2015    History of Present Illness Pt is a 75 y.o. male admitted following a fall in which he sustained multi right side rib fxs with questionable hemothorax.  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   Clinical Impression   PTA, pt independent with mobility and ADL. Worked part time. Limited this evaluation by pain. Only able to take 3 steps with O2 desat into 80s on RA. HR 122. Max A with LB ADL. Mobility appeared affected by apparent anxiety. At this time[facility] If pt makes good progress, he may be able to d/c home with HHOT. Will follow acutely to facilitate safe D/C to next venue of care.     Follow Up Recommendations  SNF;Supervision/Assistance - 24 hour    Equipment Recommendations  3 in 1 bedside comode;Tub/shower bench    Recommendations for Other Services       Precautions / Restrictions Precautions Precautions: Fall Restrictions Weight Bearing Restrictions: No      Mobility Bed Mobility               General bed mobility comments: Pt up in recliner.  Transfers Overall transfer level: Needs assistance Equipment used: Rolling walker (2 wheeled) Transfers: Sit to/from Stand Sit to Stand: +2 physical assistance;Mod assist         General transfer comment: Verbal cues needed to keep pt from holding his breath. Assist to power up.    Balance Overall balance assessment: Needs assistance;History of Falls Sitting-balance support: Feet supported;No upper extremity supported Sitting balance-Leahy Scale: Good     Standing balance support: Bilateral upper extremity supported;During functional activity Standing balance-Leahy Scale: Fair                              ADL Overall ADL's : Needs assistance/impaired         Upper Body Bathing: Minimal  assitance;Sitting   Lower Body Bathing: Maximal assistance;Sit to/from stand   Upper Body Dressing : Moderate assistance;Sitting   Lower Body Dressing: Maximal assistance;Sit to/from stand   Toilet Transfer: +2 for physical assistance;Moderate assistance   Toileting- Clothing Manipulation and Hygiene: Maximal assistance Toileting - Clothing Manipulation Details (indicate cue type and reason): Pt unable to hold urinal at this time     Functional mobility during ADLs: +2 for physical assistance;Minimal assistance  Taught "splinting technique" to use with pillow holding chest when coughing/sneezing     Vision  wears glasses   Perception     Praxis      Pertinent Vitals/Pain Pain Assessment: 0-10 Pain Score: 6  Pain Location: R chest/ribs Pain Descriptors / Indicators: Aching;Discomfort;Grimacing;Guarding Pain Intervention(s): Limited activity within patient's tolerance;Repositioned;Other (comment) (educated on using a pillow for splinting)     Hand Dominance Right   Extremity/Trunk Assessment Upper Extremity Assessment Upper Extremity Assessment: Overall WFL for tasks assessed   Lower Extremity Assessment Lower Extremity Assessment: Defer to PT evaluation RLE Deficits / Details: foot drop, Pt reports having tried different splints in the past without success.   Cervical / Trunk Assessment Cervical / Trunk Assessment: Normal   Communication Communication Communication: No difficulties   Cognition Arousal/Alertness: Awake/alert Behavior During Therapy: WFL for tasks assessed/performed Overall Cognitive Status: Within Functional Limits for tasks assessed  General Comments   Lives in [place]. Traveling here for meeting    Exercises  encouraged use of incentive spirometer     Shoulder Instructions      Home Living Family/patient expects to be discharged to:: Private residence Living Arrangements: Spouse/significant other Available  Help at Discharge: Family Type of Home: House Home Access: Level entry     Home Layout: One level     Bathroom Shower/Tub: Tub/shower unit;Curtain Shower/tub characteristics: Engineer, building services: Standard Bathroom Accessibility: Yes How Accessible: Accessible via walker Home Equipment: Grab bars - tub/shower          Prior Functioning/Environment Level of Independence: Independent        Comments: works part time in ins    OT Diagnosis: Generalized weakness;Acute pain   OT Problem List: Decreased strength;Decreased range of motion;Decreased activity tolerance;Impaired balance (sitting and/or standing);Decreased safety awareness;Decreased knowledge of use of DME or AE;Cardiopulmonary status limiting activity;Obesity;Pain   OT Treatment/Interventions: Self-care/ADL training;Therapeutic exercise;Energy conservation;DME and/or AE instruction;Therapeutic activities;Patient/family education;Balance training    OT Goals(Current goals can be found in the care plan section) Acute Rehab OT Goals Patient Stated Goal: independence OT Goal Formulation: With patient Time For Goal Achievement: 11/12/15 Potential to Achieve Goals: Good ADL Goals Pt Will Perform Upper Body Bathing: with caregiver independent in assisting;sitting;with set-up Pt Will Perform Lower Body Bathing: with set-up;with supervision Pt Will Perform Lower Body Dressing: with min assist;sit to/from stand;with caregiver independent in assisting;with adaptive equipment Pt Will Transfer to Toilet: with min guard assist;ambulating;bedside commode (with caregiver assisting) Pt Will Perform Toileting - Clothing Manipulation and hygiene: with min guard assist;with adaptive equipment  OT Frequency: Min 2X/week   Barriers to D/C: Decreased caregiver support (elderly wife)          Co-evaluation PT/OT/SLP Co-Evaluation/Treatment: Yes Reason for Co-Treatment: For patient/therapist safety PT goals addressed during  session: Mobility/safety with mobility;Balance OT goals addressed during session: ADL's and self-care      End of Session Equipment Utilized During Treatment: Gait belt;Rolling walker Nurse Communication: Mobility status  Activity Tolerance: Patient limited by pain Patient left: in chair;with call bell/phone within reach;with family/visitor present   Time: 1031-1056 OT Time Calculation (min): 25 min Charges:  OT General Charges $OT Visit: 1 Procedure OT Evaluation $OT Eval Moderate Complexity: 1 Procedure G-Codes:    Lucious Zou,HILLARY Nov 07, 2015, 2:46 PM   [facility], OTR/L  [phone] 07-Nov-2015

## 2015-10-29 NOTE — Care Management Note (Signed)
Case Management Note  Patient Details  Name: Derek Curtis MRN: [id number] Date of Birth: 12/08/1940  Subjective/Objective:  Pt admitted on 10/28/15 s/p fall with multiple rib fx.  PTA, pt independent, lives with spouse.                    Action/Plan: PT/OT recommending SNF at dc for rehab.  Will follow for discharge planning as pt progresses.    Expected Discharge Date:                  Expected Discharge Plan:  Skilled Nursing Facility  In-House Referral:  Clinical Social Work  Discharge planning Services  CM Consult  Post Acute Care Choice:    Choice offered to:     DME Arranged:    DME Agency:     HH Arranged:    HH Agency:     Status of Service:  In process, will continue to follow  If discussed at Long Length of Stay Meetings, dates discussed:    Additional Comments:  Quintella BatonJulie W. Airianna Kreischer, RN, BSN  Trauma/Neuro ICU Case Manager [phone]

## 2015-10-29 NOTE — Progress Notes (Signed)
Patient ID: Cecilio AsperLamount Benjamin, male   DOB: 10/23/1940, 75 y.o.   MRN: [id number]    Subjective: Up in chair, R rib pain  Objective: Vital signs in last 24 hours: Temp:  [97.8 F (36.6 C)-98.4 F (36.9 C)] 97.8 F (36.6 C) (08/31 0800) Pulse Rate:  [52-114] 88 (08/31 0800) Resp:  [11-29] 19 (08/31 0800) BP: (126-164)/(75-109) 153/87 (08/31 0800) SpO2:  [89 %-96 %] 94 % (08/31 0800) Weight:  [146 kg (321 lb 14 oz)-148.3 kg (327 lb)] 146 kg (321 lb 14 oz) (08/30 2000) Last BM Date: 10/27/15  Intake/Output from previous day: 08/30 0701 - 08/31 0700 In: 859.2 [P.O.:480; I.V.:379.2] Out: 125 [Urine:125] Intake/Output this shift: No intake/output data recorded.  General appearance: cooperative Resp: clear to auscultation bilaterally Chest wall: right sided chest wall tenderness Cardio: regular rate and rhythm GI: soft, NT  IS: 1000  Lab Results: CBC   Recent Labs  10/28/15 1340 10/29/15 0431  WBC 12.3* 11.6*  HGB 15.3 14.8  HCT 46.8 46.1  PLT 206 210   BMET  Recent Labs  10/28/15 1340 10/29/15 0431  NA 133* 131*  K 4.9 5.1  CL 96* 94*  CO2 29 28  GLUCOSE 249* 197*  BUN 17 21*  CREATININE 0.88 0.87  CALCIUM 9.3 9.1   PT/INR  Recent Labs  10/28/15 1340 10/29/15 0431  LABPROT 22.1* 19.9*  INR 1.91 1.68   ABG No results for input(s): PHART, HCO3 in the last 72 hours.  Invalid input(s): PCO2, PO2  Studies/Results: Ct Chest W Contrast  Result Date: 10/28/2015 CLINICAL DATA:  Chest wall pain after falling while walking. Hypertension. COPD. Diabetes. Atrial fibrillation. EXAM: CT CHEST, ABDOMEN, AND PELVIS WITH CONTRAST TECHNIQUE: Multidetector CT imaging of the chest, abdomen and pelvis was performed following the standard protocol during bolus administration of intravenous contrast. CONTRAST:  100mL ISOVUE-300 IOPAMIDOL (ISOVUE-300) INJECTION 61% COMPARISON:  Chest radiograph of 10/28/2015. FINDINGS: CT CHEST FINDINGS Cardiovascular: Aortic and branch vessel  atherosclerosis. Mild cardiomegaly, without pericardial effusion. Multivessel coronary artery atherosclerosis. Pulmonary artery enlargement, 3.7 cm outflow tract. Mediastinum/Nodes: No mediastinal or hilar adenopathy. Lungs/Pleura: Trace right-sided pleural thickening. No pneumothorax. Clear lungs. Musculoskeletal: Minimally displaced fractures of the posterior fourth through eighth ribs. CT ABDOMEN PELVIS FINDINGS Hepatobiliary: Nonspecific caudate lobe enlargement. No focal liver lesion. Normal gallbladder, without biliary ductal dilatation. Pancreas: Normal, without mass or ductal dilatation. Spleen: Normal in size, without focal abnormality. Adrenals/Urinary Tract: Normal adrenal glands. Posterior interpolar right renal 4.5 cm lesion demonstrates minimal complexity within, most consistent with a minimally complex cyst. Example image 64/series 2. More inferior interpolar right renal lesion is fluid density, most consistent with a cyst. There also too small to characterize lesions in both kidneys No hydronephrosis. Normal urinary bladder. Stomach/Bowel: Normal stomach, without wall thickening. Scattered colonic diverticula. Normal small bowel. Vascular/Lymphatic: Advanced aortic and branch vessel atherosclerosis. No abdominopelvic adenopathy. Reproductive: Normal prostate. Other: No significant free fluid.  No free intraperitoneal air. Musculoskeletal: No acute osseous abnormality. Thoracolumbar spondylosis, with degenerate disc disease at L4-5. IMPRESSION: 1. Right fourth through eighth posterior rib fractures, acute. Minimal overlying right-sided pleural thickening. No pneumothorax. 2. No other posttraumatic deformity identified. 3. Mild  degradation secondary to patient body habitus. 4.  Coronary artery atherosclerosis. Aortic atherosclerosis. 5. Pulmonary artery enlargement suggests pulmonary arterial hypertension. Electronically Signed   By: Jeronimo GreavesKyle  Talbot M.D.   On: 10/28/2015 15:10   Ct Abdomen Pelvis W  Contrast  Result Date: 10/28/2015 CLINICAL DATA:  Chest wall pain after falling while  walking. Hypertension. COPD. Diabetes. Atrial fibrillation. EXAM: CT CHEST, ABDOMEN, AND PELVIS WITH CONTRAST TECHNIQUE: Multidetector CT imaging of the chest, abdomen and pelvis was performed following the standard protocol during bolus administration of intravenous contrast. CONTRAST:  ISOVUE-300 IOPAMIDOL (ISOVUE-300) INJECTION 61% COMPARISON:  Chest radiograph of 10/28/2015. FINDINGS: CT CHEST FINDINGS Cardiovascular: Aortic and branch vessel atherosclerosis. Mild cardiomegaly, without pericardial effusion. Multivessel coronary artery atherosclerosis. Pulmonary artery enlargement, 3.7 cm outflow tract. Mediastinum/Nodes: No mediastinal or hilar adenopathy. Lungs/Pleura: Trace right-sided pleural thickening. No pneumothorax. Clear lungs. Musculoskeletal: Minimally displaced fractures of the posterior fourth through eighth ribs. CT ABDOMEN PELVIS FINDINGS Hepatobiliary: Nonspecific caudate lobe enlargement. No focal liver lesion. Normal gallbladder, without biliary ductal dilatation. Pancreas: Normal, without mass or ductal dilatation. Spleen: Normal in size, without focal abnormality. Adrenals/Urinary Tract: Normal adrenal glands. Posterior interpolar right renal 4.5 cm lesion demonstrates minimal complexity within, most consistent with a minimally complex cyst. Example image 64/series 2. More inferior interpolar right renal lesion is fluid density, most consistent with a cyst. There also too small to characterize lesions in both kidneys No hydronephrosis. Normal urinary bladder. Stomach/Bowel: Normal stomach, without wall thickening. Scattered colonic diverticula. Normal small bowel. Vascular/Lymphatic: Advanced aortic and branch vessel atherosclerosis. No abdominopelvic adenopathy. Reproductive: Normal prostate. Other: No significant free fluid.  No free intraperitoneal air. Musculoskeletal: No acute osseous  abnormality. Thoracolumbar spondylosis, with degenerate disc disease at L4-5. IMPRESSION: 1. Right fourth through eighth posterior rib fractures, acute. Minimal overlying right-sided pleural thickening. No pneumothorax. 2. No other posttraumatic deformity identified. 3. Mild  degradation secondary to patient body habitus. 4.  Coronary artery atherosclerosis. Aortic atherosclerosis. 5. Pulmonary artery enlargement suggests pulmonary arterial hypertension. Electronically Signed   By: Jeronimo Greaves M.D.   On: 10/28/2015 15:10   Dg Chest Port 1 View  Result Date: 10/29/2015 CLINICAL DATA:  Short of breath EXAM: PORTABLE CHEST 1 VIEW COMPARISON:  10/28/2015 FINDINGS: Improved aeration in the lung bases since the prior study. Decrease in bibasilar airspace disease, probably atelectasis. Negative for effusion or edema. Cardiac enlargement. IMPRESSION: Improvement in aeration in the lung bases since yesterday. Electronically Signed   By: Marlan Palau M.D.   On: 10/29/2015 07:51   Dg Chest Port 1 View  Result Date: 10/28/2015 CLINICAL DATA:  Traumatic hemopneumothorax. EXAM: PORTABLE CHEST 1 VIEW COMPARISON:  CT chest 10/28/2015.  Chest 10/28/2015. FINDINGS: Cardiac enlargement with pulmonary vascular congestion. Mild perihilar infiltration may represent early edema. No blunting of costophrenic angles. No visible pneumothorax. Shallow inspiration with atelectasis in the lung bases. Calcification of the aorta. Multiple acute appearing right rib fractures are demonstrated posteriorly. IMPRESSION: Cardiac enlargement with pulmonary vascular congestion and possible perihilar edema. Right rib fractures. No visible pneumothorax. Electronically Signed   By: Burman Nieves M.D.   On: 10/28/2015 22:18   Dg Chest Portable 1 View  Result Date: 10/28/2015 CLINICAL DATA:  Right posterior rib pain.  Shortness of breath. EXAM: PORTABLE CHEST 1 VIEW COMPARISON:  None. FINDINGS: 1336 hours. The left costophrenic angle is  partly excluded from this portable examination. The heart is enlarged. There is aortic atherosclerosis. Prominent interstitial markings are present in both perihilar regions, suspicious for mild edema. There is no confluent airspace opacity, pleural effusion or pneumothorax. No osseous abnormalities are seen. IMPRESSION: Cardiomegaly with mild pulmonary edema suspicious for congestive heart failure. No apparent osseous abnormalities. Electronically Signed   By: Carey Bullocks M.D.   On: 10/28/2015 13:47    Anti-infectives: Anti-infectives    None  Assessment/Plan: Fall R rib FX 4-8 - pulm toilet, add Robaxin, doing 100 on IS. No sig hemothorax. DM - hold metformin as had CT, SSI, continue home dulaglutide AF - holding coumadin FEN - mild hyponatremia, KVO IVF VTE - PAS, holding coumadin Dispo - floor, therapies   LOS: 1 day    Violeta Gelinas, MD, MPH, FACS Trauma: [phone] General Surgery: [phone]  10/29/2015

## 2015-10-30 LAB — BASIC METABOLIC PANEL
Anion gap: 11 (ref 5–15)
BUN: 34 mg/dL — AB (ref 6–20)
CALCIUM: 8.7 mg/dL — AB (ref 8.9–10.3)
CO2: 26 mmol/L (ref 22–32)
CREATININE: 1.39 mg/dL — AB (ref 0.61–1.24)
Chloride: 91 mmol/L — ABNORMAL LOW (ref 101–111)
GFR, EST AFRICAN AMERICAN: 55 mL/min — AB (ref >60)
GFR, EST NON AFRICAN AMERICAN: 48 mL/min — AB (ref >60)
Glucose, Bld: 198 mg/dL — ABNORMAL HIGH (ref 65–99)
Potassium: 4.4 mmol/L (ref 3.5–5.1)
SODIUM: 128 mmol/L — AB (ref 135–145)

## 2015-10-30 LAB — GLUCOSE, CAPILLARY
GLUCOSE-CAPILLARY: 268 mg/dL — AB (ref 65–99)
Glucose-Capillary: 222 mg/dL — ABNORMAL HIGH (ref 65–99)
Glucose-Capillary: 121 mg/dL — ABNORMAL HIGH (ref 65–99)
Glucose-Capillary: 160 mg/dL — ABNORMAL HIGH (ref 65–99)

## 2015-10-30 LAB — PROTIME-INR
INR: 1.68
Prothrombin Time: 20 seconds — ABNORMAL HIGH (ref 11.4–15.2)

## 2015-10-30 LAB — HEMOGLOBIN A1C
HEMOGLOBIN A1C: 9.6 % — AB (ref 4.8–5.6)
MEAN PLASMA GLUCOSE: 229 mg/dL

## 2015-10-30 MED ORDER — POLYETHYLENE GLYCOL 3350 17 G PO PACK
17.0000 g | PACK | Freq: Every day | ORAL | Status: DC
  Administered 2015-10-30 – 2015-10-31 (×2): 17 g via ORAL
  Filled 2015-10-30 (×2): qty 1

## 2015-10-30 NOTE — Progress Notes (Signed)
Physical Therapy Treatment Patient Details Name: Derek Curtis MRN: [id number] DOB: [birth year] Today's Date: 10/30/2015    History of Present Illness Pt is a 75 y.o. male admitted following a fall in which he sustained multi right side rib fxs with questionable hemothorax.  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    PT Comments    Pt able to increase his ambulation to 30 ft x1 and 10 ft X1. Continues to require +2 mod assist for standing. SpO2 dropping to 87% with initial stand but quickly recovering with cues for breathing (using 3L O2). Continue to recommend SNF for further rehabilitation following acute stay.   Follow Up Recommendations  SNF;Supervision/Assistance - 24 hour     Equipment Recommendations  Rolling walker with 5" wheels    Recommendations for Other Services       Precautions / Restrictions Precautions Precautions: Fall Restrictions Weight Bearing Restrictions: No    Mobility  Bed Mobility               General bed mobility comments: pt in chair upon arrival  Transfers Overall transfer level: Needs assistance Equipment used: Rolling walker (2 wheeled) Transfers: Sit to/from Stand Sit to Stand: +2 physical assistance;Mod assist         General transfer comment: assist provided from under arms for support. Cues for hand placement provided.   Ambulation/Gait Ambulation/Gait assistance: Min guard Ambulation Distance (Feet): 30 Feet (41ft x1, 10 ft x1) Assistive device: Rolling walker (2 wheeled) Gait Pattern/deviations: Step-through pattern;Decreased step length - right;Decreased step length - left;Trunk flexed Gait velocity: slow pattern   General Gait Details: pt with mildly flexed trunk, taking 2 standing breaks for breathing.    Stairs            Wheelchair Mobility    Modified Rankin (Stroke Patients Only)       Balance Overall balance assessment: Needs assistance Sitting-balance  support: No upper extremity supported Sitting balance-Leahy Scale: Good     Standing balance support: Bilateral upper extremity supported Standing balance-Leahy Scale: Poor Standing balance comment: using rw                    Cognition Arousal/Alertness: Awake/alert Behavior During Therapy: WFL for tasks assessed/performed Overall Cognitive Status: Within Functional Limits for tasks assessed                      Exercises      General Comments General comments (skin integrity, edema, etc.): SpO2- dropping from 97% to 87% with initial standing. Returning to >94% quickly with breathing encouraged. O2 staying above 90% throughout ambulation with 3L O2.       Pertinent Vitals/Pain Pain Assessment: Faces Faces Pain Scale: Hurts even more (with movement) Pain Location: Rt ribs Pain Descriptors / Indicators: Sharp;Grimacing Pain Intervention(s): Limited activity within patient's tolerance;Monitored during session    Home Living                      Prior Function            PT Goals (current goals can now be found in the care plan section) Acute Rehab PT Goals Patient Stated Goal: move without pain PT Goal Formulation: With patient Time For Goal Achievement: 11/12/15 Potential to Achieve Goals: Good Progress towards PT goals: Progressing toward goals    Frequency  Min 5X/week    PT Plan Current plan  remains appropriate    Co-evaluation             End of Session Equipment Utilized During Treatment: Oxygen (pt declined use of gait belt) Activity Tolerance: Patient limited by fatigue Patient left: in chair;with call bell/phone within reach;with family/visitor present     Time: [id number] PT Time Calculation (min) (ACUTE ONLY): 17 min  Charges:  $Gait Training: 23-37 mins                    G Codes:      Christiane HaBenjamin J. Jakim Drapeau, PT, CSCS Pager [phone] Office [phone]  10/30/2015, 12:01 PM

## 2015-10-30 NOTE — Progress Notes (Signed)
Patient ID: Derek Curtis, male   DOB: 12/22/1940, 75 y.o.   MRN: [id number]  [facility]     Subjective: Right rib pain slowly improving, well controlled on robaxin and tramadol. Worked with PT/OT yesterday who recommend patient going to SNF at discharge.  Objective: Vital signs in last 24 hours: Temp:  [97.6 F (36.4 C)-98.4 F (36.9 C)] 97.6 F (36.4 C) (09/01 0437) Pulse Rate:  [70-99] 76 (09/01 0437) Resp:  [15-19] 19 (09/01 0437) BP: (116-146)/(81-93) 116/81 (09/01 0437) SpO2:  [92 %-98 %] 95 % (09/01 0437) Weight:  [326 lb 8 oz (148.1 kg)] 326 lb 8 oz (148.1 kg) (08/31 1525) Last BM Date: 10/27/15  Intake/Output from previous day: 08/31 0701 - 09/01 0700 In: 2525.8 [P.O.:2400; I.V.:125.8] Out: 625 [Urine:625] Intake/Output this shift: No intake/output data recorded.  PE: General appearance: cooperative Resp: clear to auscultation bilaterally Chest wall: right sided chest wall tenderness Cardio: regular rate and rhythm GI: obese, NT   IS 1000  Lab Results:   Recent Labs  10/28/15 1340 10/29/15 0431  WBC 12.3* 11.6*  HGB 15.3 14.8  HCT 46.8 46.1  PLT 206 210   BMET  Recent Labs  10/29/15 0431 10/30/15 0545  NA 131* 128*  K 5.1 4.4  CL 94* 91*  CO2 28 26  GLUCOSE 197* 198*  BUN 21* 34*  CREATININE 0.87 1.39*  CALCIUM 9.1 8.7*   PT/INR  Recent Labs  10/29/15 0431 10/30/15 0545  LABPROT 19.9* 20.0*  INR 1.68 1.68   CMP     Component Value Date/Time   NA 128 (L) 10/30/2015 0545   K 4.4 10/30/2015 0545   CL 91 (L) 10/30/2015 0545   CO2 26 10/30/2015 0545   GLUCOSE 198 (H) 10/30/2015 0545   BUN 34 (H) 10/30/2015 0545   CREATININE 1.39 (H) 10/30/2015 0545   CALCIUM 8.7 (L) 10/30/2015 0545   GFRNONAA 48 (L) 10/30/2015 0545   GFRAA 55 (L) 10/30/2015 0545   Lipase  No results found for: LIPASE     Studies/Results: Ct Chest W Contrast  Result Date: 10/28/2015 CLINICAL DATA:  Chest wall pain after falling  while walking. Hypertension. COPD. Diabetes. Atrial fibrillation. EXAM: CT CHEST, ABDOMEN, AND PELVIS WITH CONTRAST TECHNIQUE: Multidetector CT imaging of the chest, abdomen and pelvis was performed following the standard protocol during bolus administration of intravenous contrast. CONTRAST:  100mL ISOVUE-300 IOPAMIDOL (ISOVUE-300) INJECTION 61% COMPARISON:  Chest radiograph of 10/28/2015. FINDINGS: CT CHEST FINDINGS Cardiovascular: Aortic and branch vessel atherosclerosis. Mild cardiomegaly, without pericardial effusion. Multivessel coronary artery atherosclerosis. Pulmonary artery enlargement, 3.7 cm outflow tract. Mediastinum/Nodes: No mediastinal or hilar adenopathy. Lungs/Pleura: Trace right-sided pleural thickening. No pneumothorax. Clear lungs. Musculoskeletal: Minimally displaced fractures of the posterior fourth through eighth ribs. CT ABDOMEN PELVIS FINDINGS Hepatobiliary: Nonspecific caudate lobe enlargement. No focal liver lesion. Normal gallbladder, without biliary ductal dilatation. Pancreas: Normal, without mass or ductal dilatation. Spleen: Normal in size, without focal abnormality. Adrenals/Urinary Tract: Normal adrenal glands. Posterior interpolar right renal 4.5 cm lesion demonstrates minimal complexity within, most consistent with a minimally complex cyst. Example image 64/series 2. More inferior interpolar right renal lesion is fluid density, most consistent with a cyst. There also too small to characterize lesions in both kidneys No hydronephrosis. Normal urinary bladder. Stomach/Bowel: Normal stomach, without wall thickening. Scattered colonic diverticula. Normal small bowel. Vascular/Lymphatic: Advanced aortic and branch vessel atherosclerosis. No abdominopelvic adenopathy. Reproductive: Normal prostate. Other: No significant free fluid.  No free intraperitoneal air. Musculoskeletal: No  acute osseous abnormality. Thoracolumbar spondylosis, with degenerate disc disease at L4-5. IMPRESSION: 1.  Right fourth through eighth posterior rib fractures, acute. Minimal overlying right-sided pleural thickening. No pneumothorax. 2. No other posttraumatic deformity identified. 3. Mild  degradation secondary to patient body habitus. 4.  Coronary artery atherosclerosis. Aortic atherosclerosis. 5. Pulmonary artery enlargement suggests pulmonary arterial hypertension. Electronically Signed   By: Jeronimo Greaves M.D.   On: 10/28/2015 15:10   Ct Abdomen Pelvis W Contrast  Result Date: 10/28/2015 CLINICAL DATA:  Chest wall pain after falling while walking. Hypertension. COPD. Diabetes. Atrial fibrillation. EXAM: CT CHEST, ABDOMEN, AND PELVIS WITH CONTRAST TECHNIQUE: Multidetector CT imaging of the chest, abdomen and pelvis was performed following the standard protocol during bolus administration of intravenous contrast. CONTRAST:  ISOVUE-300 IOPAMIDOL (ISOVUE-300) INJECTION 61% COMPARISON:  Chest radiograph of 10/28/2015. FINDINGS: CT CHEST FINDINGS Cardiovascular: Aortic and branch vessel atherosclerosis. Mild cardiomegaly, without pericardial effusion. Multivessel coronary artery atherosclerosis. Pulmonary artery enlargement, 3.7 cm outflow tract. Mediastinum/Nodes: No mediastinal or hilar adenopathy. Lungs/Pleura: Trace right-sided pleural thickening. No pneumothorax. Clear lungs. Musculoskeletal: Minimally displaced fractures of the posterior fourth through eighth ribs. CT ABDOMEN PELVIS FINDINGS Hepatobiliary: Nonspecific caudate lobe enlargement. No focal liver lesion. Normal gallbladder, without biliary ductal dilatation. Pancreas: Normal, without mass or ductal dilatation. Spleen: Normal in size, without focal abnormality. Adrenals/Urinary Tract: Normal adrenal glands. Posterior interpolar right renal 4.5 cm lesion demonstrates minimal complexity within, most consistent with a minimally complex cyst. Example image 64/series 2. More inferior interpolar right renal lesion is fluid density, most consistent with  a cyst. There also too small to characterize lesions in both kidneys No hydronephrosis. Normal urinary bladder. Stomach/Bowel: Normal stomach, without wall thickening. Scattered colonic diverticula. Normal small bowel. Vascular/Lymphatic: Advanced aortic and branch vessel atherosclerosis. No abdominopelvic adenopathy. Reproductive: Normal prostate. Other: No significant free fluid.  No free intraperitoneal air. Musculoskeletal: No acute osseous abnormality. Thoracolumbar spondylosis, with degenerate disc disease at L4-5. IMPRESSION: 1. Right fourth through eighth posterior rib fractures, acute. Minimal overlying right-sided pleural thickening. No pneumothorax. 2. No other posttraumatic deformity identified. 3. Mild  degradation secondary to patient body habitus. 4.  Coronary artery atherosclerosis. Aortic atherosclerosis. 5. Pulmonary artery enlargement suggests pulmonary arterial hypertension. Electronically Signed   By: Jeronimo Greaves M.D.   On: 10/28/2015 15:10   Dg Chest Port 1 View  Result Date: 10/29/2015 CLINICAL DATA:  Short of breath EXAM: PORTABLE CHEST 1 VIEW COMPARISON:  10/28/2015 FINDINGS: Improved aeration in the lung bases since the prior study. Decrease in bibasilar airspace disease, probably atelectasis. Negative for effusion or edema. Cardiac enlargement. IMPRESSION: Improvement in aeration in the lung bases since yesterday. Electronically Signed   By: Marlan Palau M.D.   On: 10/29/2015 07:51   Dg Chest Port 1 View  Result Date: 10/28/2015 CLINICAL DATA:  Traumatic hemopneumothorax. EXAM: PORTABLE CHEST 1 VIEW COMPARISON:  CT chest 10/28/2015.  Chest 10/28/2015. FINDINGS: Cardiac enlargement with pulmonary vascular congestion. Mild perihilar infiltration may represent early edema. No blunting of costophrenic angles. No visible pneumothorax. Shallow inspiration with atelectasis in the lung bases. Calcification of the aorta. Multiple acute appearing right rib fractures are demonstrated  posteriorly. IMPRESSION: Cardiac enlargement with pulmonary vascular congestion and possible perihilar edema. Right rib fractures. No visible pneumothorax. Electronically Signed   By: Burman Nieves M.D.   On: 10/28/2015 22:18   Dg Chest Portable 1 View  Result Date: 10/28/2015 CLINICAL DATA:  Right posterior rib pain.  Shortness of breath. EXAM: PORTABLE CHEST 1 VIEW COMPARISON:  None. FINDINGS: 1336 hours. The left costophrenic angle is partly excluded from this portable examination. The heart is enlarged. There is aortic atherosclerosis. Prominent interstitial markings are present in both perihilar regions, suspicious for mild edema. There is no confluent airspace opacity, pleural effusion or pneumothorax. No osseous abnormalities are seen. IMPRESSION: Cardiomegaly with mild pulmonary edema suspicious for congestive heart failure. No apparent osseous abnormalities. Electronically Signed   By: Carey Bullocks M.D.   On: 10/28/2015 13:47    Anti-infectives: Anti-infectives    None       Assessment/Plan Fall R rib FX 4-8 - continue pulmonary toilet, Robaxin and tramadol for pain. Xray with no hemothorax yesterday.  DM - hold metformin as had CT, SSI, home dose Levemir 36 units q HS restarted yesterday. AF - holding coumadin FEN - mild hyponatremia and elevated BUN/Cr today, increase KVO IVF. Carb modified diet. VTE - PAS, holding coumadin. Will recheck CBC in am and possibly restart tomorrow. Dispo - continue therapies. Plan SNF at discharge.   LOS: 2 days    Edson Snowball , [facility] 10/30/2015, 8:05 AM Pager: [phone] Consults: [phone] Mon-Fri 7:00 am-4:30 pm Sat-Sun 7:00 am-11:30 am

## 2015-10-30 NOTE — Clinical Social Work Note (Signed)
Clinical Social Work Assessment  Patient Details  Name: Derek Curtis MRN: [id number] Date of Birth: 01/27/1941  Date of referral:  10/30/15               Reason for consult:  Facility Placement                Permission sought to share information with:  Family Supports Permission granted to share information::  Yes, Verbal Permission Granted  Name::     Frederick Marro  Relationship::  Wife  Contact Information:  [phone]  Housing/Transportation Living arrangements for the past 2 months:  [place] of Information:  Patient Patient Interpreter Needed:  None Criminal Activity/Legal Involvement Pertinent to Current Situation/Hospitalization:  No - Comment as needed Significant Relationships:  Spouse, Adult Children Lives with:  Spouse Do you feel safe going back to the place where you live?  Yes Need for family participation in patient care:  Yes (Comment)  Care giving concerns:  Patient wife was hopeful for inpatient rehab placement, however patient is unable to tolerate necessary therapies.  Patient wife understanding and has requested CSW reach out to patient daughter for further assistance.   Social Worker assessment / plan:  Holiday representative met with patient and patient wife at bedside to offer support and discuss patient needs at discharge.  Patient states that he was in town for a meeting when he tripped and fell on his way in to the meeting.  Patient and patient wife currently live in [place] and are requesting placement in the [place] area.  Patient and patient wife provided CSW permission to reach out to patient daughter, Sonia Baller who lives in [place] and works for [employer], to provide facility options at discharge.  Patient daughter requested [facility] in [place].  CSW initiated referral and followed up with facility.  [place] Genevive Bi will provide referral to nursing and if approved by nursing, admissions coordinator to follow up tomorrow  with bed offer.  No additional facilities provided.  Patient son plans to provide transportation at discharge.  CSW updated MD and will leave report for potential weekend discharge.  Patient and patient family updated with current discharge plan.  CSW remains available for support and to facilitate patient discharge needs once medically stable.  Employment status:  Retired Forensic scientist:  Medicare PT Recommendations:  [place] / Referral to community resources:  [place]  Patient/Family's Response to care:  Patient and family verbalize understanding of CSW role and appreciation for support and involvement.  Patient and family agreeable to SNF placement once patient is medically stable and bed available.  Patient/Family's Understanding of and Emotional Response to Diagnosis, Current Treatment, and Prognosis:  Patient and family understanding and realistic about patient limitations and barriers for return home.  Patient and family plan to support and assist as much as needed to encourage patient return home.  Emotional Assessment Appearance:  Appears stated age Attitude/Demeanor/Rapport:   (Appropriate and Engaged) Affect (typically observed):  Appropriate, Calm, Hopeful, Pleasant Orientation:  Oriented to Self, Oriented to Situation, Oriented to Place, Oriented to  Time Alcohol / Substance use:  Never Used Psych involvement (Current and /or in the community):  No (Comment)  Discharge Needs  Concerns to be addressed:  Discharge Planning Concerns Readmission within the last 30 days:  No Current discharge risk:  None Barriers to Discharge:  Continued Medical Work up  [employer], [place]

## 2015-10-30 NOTE — Clinical Social Work Placement (Signed)
   CLINICAL SOCIAL WORK PLACEMENT  NOTE  Date:  10/30/2015  Patient Details  Name: Derek Curtis MRN: [id number] Date of Birth: 08/12/1940  Clinical Social Work is seeking post-discharge placement for this patient at the Skilled  Nursing Facility level of care (*CSW will initial, date and re-position this form in  chart as items are completed):  Yes   Patient/family provided with [place] Clinical Social Work Department's list of facilities offering this level of care within the geographic area requested by the patient (or if unable, by the patient's family).  Yes   Patient/family informed of their freedom to choose among providers that offer the needed level of care, that participate in Medicare, Medicaid or managed care program needed by the patient, have an available bed and are willing to accept the patient.  Yes   Patient/family informed of [facility]'s ownership interest in [facility] and [facility], as well as of the fact that they are under no obligation to receive care at these facilities.  PASRR submitted to EDS on 10/29/15     PASRR number received on 10/29/15     Existing PASRR number confirmed on       FL2 transmitted to all facilities in geographic area requested by pt/family on 10/30/15     FL2 transmitted to all facilities within larger geographic area on       Patient informed that his/her managed care company has contracts with or will negotiate with certain facilities, including the following:            Patient/family informed of bed offers received.  Patient chooses bed at       Physician recommends and patient chooses bed at      Patient to be transferred to   on  .  Patient to be transferred to facility by       Patient family notified on   of transfer.  Name of family member notified:        PHYSICIAN Please sign FL2     Additional Comment:    Macario GoldsJesse Michail Boyte, LCSW [phone]

## 2015-10-30 NOTE — Progress Notes (Signed)
Inpatient Diabetes Program Recommendations  AACE/ADA: New Consensus Statement on Inpatient Glycemic Control (2015)  Target Ranges:  Prepandial:   less than 140 mg/dL      Peak postprandial:   less than 180 mg/dL (1-2 hours)      Critically ill patients:  140 - 180 mg/dL   Lab Results  Component Value Date   GLUCAP 222 (H) 10/30/2015   HGBA1C 9.6 (H) 10/28/2015    Review of Glycemic Control:  Results for Cecilio AsperBURNS, [place] (MRN [id number]) as of 10/30/2015 10:47  Ref. Range 10/29/2015 08:11 10/29/2015 12:28 10/29/2015 17:18 10/29/2015 22:01 10/30/2015 07:29  Glucose-Capillary Latest Ref Range: 65 - 99 mg/dL 578197 (H) 469183 (H) 629183 (H) 221 (H) 222 (H)   Inpatient Diabetes Program Recommendations:    Consider increasing Levemir to 42 units q HS.   Thanks,  Beryl MeagerJenny Lenny Bouchillon, RN, BC-ADM Inpatient Diabetes Coordinator Pager [phone] (8a-5p)

## 2015-10-30 NOTE — Care Management Important Message (Signed)
Important Message  Patient Details  Name: Derek Curtis MRN: [id number] Date of Birth: 05/26/1940   Medicare Important Message Given:  Yes    Jaelle Campanile Stefan ChurchBratton 10/30/2015, 10:58 AM

## 2015-10-30 NOTE — Progress Notes (Signed)
Pt. just wants to keep n/c oxygen on this evening, has own CPAP @ bedside, notified to call if needed.

## 2015-10-31 LAB — BASIC METABOLIC PANEL
Anion gap: 8 (ref 5–15)
BUN: 44 mg/dL — AB (ref 6–20)
CHLORIDE: 89 mmol/L — AB (ref 101–111)
CO2: 28 mmol/L (ref 22–32)
Calcium: 8.4 mg/dL — ABNORMAL LOW (ref 8.9–10.3)
Creatinine, Ser: 1.26 mg/dL — ABNORMAL HIGH (ref 0.61–1.24)
GFR calc Af Amer: >60 mL/min (ref >60)
GFR calc non Af Amer: 54 mL/min — ABNORMAL LOW (ref >60)
GLUCOSE: 142 mg/dL — AB (ref 65–99)
POTASSIUM: 4.8 mmol/L (ref 3.5–5.1)
SODIUM: 125 mmol/L — AB (ref 135–145)

## 2015-10-31 LAB — GLUCOSE, CAPILLARY
GLUCOSE-CAPILLARY: 139 mg/dL — AB (ref 65–99)
GLUCOSE-CAPILLARY: 267 mg/dL — AB (ref 65–99)
Glucose-Capillary: 204 mg/dL — ABNORMAL HIGH (ref 65–99)
Glucose-Capillary: 157 mg/dL — ABNORMAL HIGH (ref 65–99)

## 2015-10-31 LAB — PROTIME-INR
INR: 1.6
Prothrombin Time: 19.2 seconds — ABNORMAL HIGH (ref 11.4–15.2)

## 2015-10-31 LAB — CBC
HEMATOCRIT: 40.3 % (ref 39.0–52.0)
Hemoglobin: 12.7 g/dL — ABNORMAL LOW (ref 13.0–17.0)
MCH: 28.8 pg (ref 26.0–34.0)
MCHC: 31.5 g/dL (ref 30.0–36.0)
MCV: 91.4 fL (ref 78.0–100.0)
Platelets: 192 10*3/uL (ref 150–400)
RBC: 4.41 MIL/uL (ref 4.22–5.81)
RDW: 14.1 % (ref 11.5–15.5)
WBC: 11.2 10*3/uL — AB (ref 4.0–10.5)

## 2015-10-31 MED ORDER — POLYETHYLENE GLYCOL 3350 17 G PO PACK
17.0000 g | PACK | Freq: Every day | ORAL | Status: DC
  Administered 2015-10-31 – 2015-11-01 (×4): 17 g via ORAL
  Filled 2015-10-31 (×4): qty 1

## 2015-10-31 MED ORDER — WARFARIN - PHARMACIST DOSING INPATIENT: Freq: Every day | Status: DC

## 2015-10-31 MED ORDER — WARFARIN SODIUM 5 MG PO TABS
7.5000 mg | ORAL_TABLET | Freq: Once | ORAL | Status: AC
  Administered 2015-10-31: 7.5 mg via ORAL
  Filled 2015-10-31: qty 2

## 2015-10-31 MED ORDER — MILK AND MOLASSES ENEMA
1.0000 | Freq: Once | RECTAL | Status: AC
  Administered 2015-10-31: 250 mL via RECTAL
  Filled 2015-10-31: qty 250

## 2015-10-31 MED ORDER — SODIUM CHLORIDE 0.9 % IV SOLN
INTRAVENOUS | Status: DC
  Administered 2015-10-31 – 2015-11-01 (×3): via INTRAVENOUS
  Filled 2015-10-31 (×3): qty 1000

## 2015-10-31 NOTE — Progress Notes (Signed)
ANTICOAGULATION CONSULT NOTE - Initial Consult  Pharmacy Consult for Coumadin Indication: atrial fibrillation  Allergies  Allergen Reactions  . Penicillins Rash    Has patient had a PCN reaction causing immediate rash, facial/tongue/throat swelling, SOB or lightheadedness with hypotension: Yes Has patient had a PCN reaction causing severe rash involving mucus membranes or skin necrosis: No Has patient had a PCN reaction that required hospitalization No Has patient had a PCN reaction occurring within the last 10 years: No If all of the above answers are "NO", then may proceed with Cephalosporin use.     Patient Measurements: Height: 6\' 1"  (185.4 cm) Weight: (!) 326 lb 8 oz (148.1 kg) IBW/kg (Calculated) : 79.9  Vital Signs: Temp: 98.3 F (36.8 C) (09/02 0949) Temp Source: Oral (09/02 0949) BP: 114/83 (09/02 0949) Pulse Rate: 71 (09/02 0949)  Labs:  Recent Labs  10/28/15 1340 10/29/15 0431 10/30/15 0545 10/31/15 0327  HGB 15.3 14.8  --  12.7*  HCT 46.8 46.1  --  40.3  PLT 206 210  --  192  LABPROT 22.1* 19.9* 20.0* 19.2*  INR 1.91 1.68 1.68 1.60  CREATININE 0.88 0.87 1.39* 1.26*    Estimated Creatinine Clearance: 75.6 mL/min (by C-G formula based on SCr of 1.26 mg/dL).   Medical History: Past Medical History:  Diagnosis Date  . Atrial fibrillation (HCC)   . COPD (chronic obstructive pulmonary disease) (HCC)   . Diabetes mellitus without complication (HCC)   . Hypertension   . Rheumatic fever     Assessment: 75 yo M presented on 8/30 after a fall. On Coumadin 5mg  daily exc for 7.5mg  on Mon/Wed/Fri PTA for Afib. INR on admit was 1.91. Coumadin was held and now to restart on 9/2. INR this am is down to 1.6. Hgb ok at 12.7, plts wnl. No s/s of bleed.  Goal of Therapy:  INR 2-3 Monitor platelets by anticoagulation protocol: Yes   Plan:  Give Coumadin 7.5mg  PO x 1 tonight Monitor daily INR, CBC, s/s of bleed  [place]  [name] 10/31/2015,10:53 AM

## 2015-10-31 NOTE — Progress Notes (Signed)
Patient has home CPAP machine but does not want to wear CPAP tonight, preferring to use only nasal cannula 02.

## 2015-10-31 NOTE — Progress Notes (Signed)
  Subjective: Right rib pain is slowly improving Tolerating diet Still constipated and bloating Has a walker.  Working with PT and OT.  They recommended SNF at discharge.  He is planning to go to SNF in [place] tomorrow Apparently our Child psychotherapistsocial worker and case management team have all of this arranged  Creatinine 1.26, slightly less.  BUN 44  slightly greater.  Sodium 125.  Chloride 89.  Potassium 4.8.  Glucose 142.  Hemoglobin 12.7.  WBC 11,200.   INR 1.6. Pharmacy managing Coumadin  Objective: Vital signs in last 24 hours: Temp:  [97.7 F (36.5 C)-98.3 F (36.8 C)] 98.3 F (36.8 C) (09/02 0949) Pulse Rate:  [70-82] 71 (09/02 0949) Resp:  [17-20] 19 (09/02 0949) BP: (101-129)/(67-84) 114/83 (09/02 0949) SpO2:  [95 %-98 %] 97 % (09/02 0949) Last BM Date: 10/27/15  Intake/Output from previous day: 09/01 0701 - 09/02 0700 In: 1200 [P.O.:600; I.V.:600] Out: 625 [Urine:625] Intake/Output this shift: No intake/output data recorded.  General appearance: Alert and cooperative.  Wife is here.  Huge man sitting in chair. Resp: Lungs are clear but lots of pain when he takes the breath.  Vital capacity 1000 mL.  Tender right posterior lateral ribs GI: Obese.  Distended.  Tympanitic.  Tight.  Bowel sounds present.  Lab Results:   Recent Labs  10/29/15 0431 10/31/15 0327  WBC 11.6* 11.2*  HGB 14.8 12.7*  HCT 46.1 40.3  PLT 210 192   BMET  Recent Labs  10/30/15 0545 10/31/15 0327  NA 128* 125*  K 4.4 4.8  CL 91* 89*  CO2 26 28  GLUCOSE 198* 142*  BUN 34* 44*  CREATININE 1.39* 1.26*  CALCIUM 8.7* 8.4*   PT/INR  Recent Labs  10/30/15 0545 10/31/15 0327  LABPROT 20.0* 19.2*  INR 1.68 1.60   ABG No results for input(s): PHART, HCO3 in the last 72 hours.  Invalid input(s): PCO2, PO2  Studies/Results: No results found.  Anti-infectives: Anti-infectives    None      Assessment/Plan:  Fall R rib FX 4-8- continue pulmonary toilet, Robaxin and  tramadol for pain. Xray with no hemothorax yesterday.  DM- hold metformin as had CT, SSI, home dose Levemir 36 units q HS restarted yesterday. AF- restart coumadin FEN- mild hyponatremia and elevated BUN/Cr today, increase KVO IVF. Carb modified diet.          Severe constipation.  Increase MiraLAX frequency.  Milk and molasses enema.  VTE- PAS, restart coumadin per pharm  Dispo- continue therapies. Plan SNF in [place] at discharge.  Range was are being made.  Possibly discharge tomorrow.    LOS: 3 days    Cherlynn Popiel M 10/31/2015

## 2015-10-31 NOTE — Progress Notes (Signed)
Pt had a moderate bowel movement after having the molasses and milk enema

## 2015-10-31 NOTE — Progress Notes (Signed)
Pt. using n/c 2 lpm this evening, aware to notify if help needed with home CPAP at bedside.

## 2015-11-01 LAB — GLUCOSE, CAPILLARY: GLUCOSE-CAPILLARY: 84 mg/dL (ref 65–99)

## 2015-11-01 LAB — BASIC METABOLIC PANEL
ANION GAP: 5 (ref 5–15)
BUN: 40 mg/dL — AB (ref 6–20)
CALCIUM: 8.2 mg/dL — AB (ref 8.9–10.3)
CO2: 27 mmol/L (ref 22–32)
CREATININE: 0.78 mg/dL (ref 0.61–1.24)
Chloride: 91 mmol/L — ABNORMAL LOW (ref 101–111)
GFR calc Af Amer: >60 mL/min (ref >60)
GLUCOSE: 99 mg/dL (ref 65–99)
Potassium: 4.9 mmol/L (ref 3.5–5.1)
Sodium: 123 mmol/L — ABNORMAL LOW (ref 135–145)

## 2015-11-01 LAB — PROTIME-INR
INR: 1.45
Prothrombin Time: 17.8 seconds — ABNORMAL HIGH (ref 11.4–15.2)

## 2015-11-01 MED ORDER — DULAGLUTIDE 1.5 MG/0.5ML ~~LOC~~ SOAJ: 0.5000 mL | SUBCUTANEOUS | Status: AC

## 2015-11-01 MED ORDER — MORPHINE SULFATE (PF) 2 MG/ML IV SOLN: 2.0000 mg | INTRAVENOUS | 0 refills | Status: DC | PRN

## 2015-11-01 MED ORDER — GLIMEPIRIDE 4 MG PO TABS: 4.0000 mg | ORAL_TABLET | Freq: Every day | ORAL | Status: AC

## 2015-11-01 MED ORDER — DOCUSATE SODIUM 100 MG PO CAPS: 100.0000 mg | ORAL_CAPSULE | Freq: Two times a day (BID) | ORAL | 0 refills | Status: AC

## 2015-11-01 MED ORDER — DILTIAZEM HCL ER COATED BEADS 240 MG PO CP24: 240.0000 mg | ORAL_CAPSULE | Freq: Every day | ORAL | Status: AC

## 2015-11-01 MED ORDER — TRAMADOL HCL 50 MG PO TABS: 50.0000 mg | ORAL_TABLET | Freq: Four times a day (QID) | ORAL | Status: DC | PRN

## 2015-11-01 MED ORDER — TRAMADOL HCL 50 MG PO TABS: 50.0000 mg | ORAL_TABLET | Freq: Four times a day (QID) | ORAL | 0 refills | Status: AC | PRN

## 2015-11-01 MED ORDER — INSULIN DETEMIR 100 UNIT/ML ~~LOC~~ SOLN: 36.0000 [IU] | Freq: Every day | SUBCUTANEOUS | 11 refills | Status: AC

## 2015-11-01 MED ORDER — INSULIN ASPART 100 UNIT/ML ~~LOC~~ SOLN: 0.0000 [IU] | Freq: Every day | SUBCUTANEOUS | 11 refills | Status: AC

## 2015-11-01 MED ORDER — METHOCARBAMOL 500 MG PO TABS: 1000.0000 mg | ORAL_TABLET | Freq: Three times a day (TID) | ORAL | Status: AC | PRN

## 2015-11-01 MED ORDER — INSULIN ASPART 100 UNIT/ML ~~LOC~~ SOLN: 0.0000 [IU] | Freq: Three times a day (TID) | SUBCUTANEOUS | 11 refills | Status: AC

## 2015-11-01 MED ORDER — POLYETHYLENE GLYCOL 3350 17 G PO PACK: 17.0000 g | PACK | Freq: Two times a day (BID) | ORAL | 0 refills | Status: AC

## 2015-11-01 MED ORDER — HYDROCHLOROTHIAZIDE 25 MG PO TABS: 25.0000 mg | ORAL_TABLET | Freq: Every day | ORAL | Status: AC

## 2015-11-01 MED ORDER — ONDANSETRON HCL 4 MG PO TABS: 4.0000 mg | ORAL_TABLET | Freq: Four times a day (QID) | ORAL | 0 refills | Status: AC | PRN

## 2015-11-01 MED ORDER — MORPHINE SULFATE (PF) 2 MG/ML IV SOLN: 2.0000 mg | INTRAVENOUS | 0 refills | Status: AC | PRN

## 2015-11-01 MED ORDER — BENAZEPRIL HCL 40 MG PO TABS: 40.0000 mg | ORAL_TABLET | Freq: Every day | ORAL | Status: AC

## 2015-11-01 MED ORDER — ATORVASTATIN CALCIUM 20 MG PO TABS: 20.0000 mg | ORAL_TABLET | Freq: Every day | ORAL | Status: AC

## 2015-11-01 MED ORDER — NAPROXEN 500 MG PO TABS: 500.0000 mg | ORAL_TABLET | Freq: Two times a day (BID) | ORAL | Status: AC

## 2015-11-01 NOTE — Discharge Summary (Signed)
[facility] Discharge Summary   Patient ID: Derek Curtis MRN: [id number] DOB/AGE: 76/09/1940 76 y.o.  Admit date: 10/28/2015 Discharge date: 11/01/2015  Admitting Diagnosis: Fall Multiple rib fractures, right   Discharge Diagnosis Patient Active Problem List   Diagnosis Date Noted  . Multiple fractures of ribs of right side 10/28/2015    Consultants None  Imaging: CT chest with contrast 10/28/15: 1. Right fourth through eighth posterior rib fractures, acute. Minimal overlying right-sided pleural thickening. No pneumothorax. 2. No other posttraumatic deformity identified. 3. Mild  degradation secondary to patient body habitus. 4.  Coronary artery atherosclerosis. Aortic atherosclerosis. 5. Pulmonary artery enlargement suggests pulmonary arterial Hypertension.  Chest xray 10/28/15: Cardiac enlargement with pulmonary vascular congestion and possible perihilar edema. Right rib fractures. No visible pneumothorax.  Chest xray 10/29/15: Improvement in aeration in the lung bases since yesterday.   Procedures None  Hospital Course:  [name] is a 75yo male PMH of DM and AF (on coumadin) who [facility] after a ground level fall onto the sidewalk on his right side. He has a right foot drop which impairs his balance. He initially went to urgent care and was advised to go to the ED.  Workup showed multiple right rib fractures. Patient takes coumadin and high risk for hemothorax, therefore he was admitted for observation with coumadin held.  He had 2 subsequent xrays that were negative for hemothorax. Coumadin was restarted 10/31/15. While admitted his BUN/Cr did elevate some but responded well to increased IVF. He also suffered from constipation which responded well to enema and increased miralax. Mr. Lawerance BachBurns worked with therapies during admission who recommended SNF for further rehabilitation following acute stay. Four days after admission it was felt that he  was stable for discharge to SNF in [place].    Physical Exam: General appearance: NAD, cooperative Resp: clear to auscultation bilaterally Chest wall: right sided chest wall tenderness Cardio: regular rate and rhythm GI: obese, NT     Medication List    STOP taking these medications   BAYER CONTOUR TEST test strip Generic drug:  glucose blood   BD ULTRA-FINE PEN NEEDLES 29G X 12.7MM Misc Generic drug:  Insulin Pen Needle   metFORMIN 500 MG 24 hr tablet Commonly known as:  GLUCOPHAGE-XR   simvastatin 40 MG tablet Commonly known as:  ZOCOR     TAKE these medications   atorvastatin 20 MG tablet Commonly known as:  LIPITOR Take 1 tablet (20 mg total) by mouth daily at 6 PM.   benazepril 40 MG tablet Commonly known as:  LOTENSIN Take 1 tablet (40 mg total) by mouth daily.   diltiazem 240 MG 24 hr capsule Commonly known as:  CARTIA XT Take 1 capsule (240 mg total) by mouth daily.   docusate sodium 100 MG capsule Commonly known as:  COLACE Take 1 capsule (100 mg total) by mouth 2 (two) times daily.   Dulaglutide 1.5 MG/0.5ML Sopn Commonly known as:  TRULICITY Inject 0.5 mLs into the skin once a week.   glimepiride 4 MG tablet Commonly known as:  AMARYL Take 1 tablet (4 mg total) by mouth daily.   hydrochlorothiazide 25 MG tablet Commonly known as:  HYDRODIURIL Take 1 tablet (25 mg total) by mouth daily.   insulin aspart 100 UNIT/ML injection Commonly known as:  novoLOG Inject 0-20 Units into the skin 3 (three) times daily with meals.   insulin aspart 100 UNIT/ML injection Commonly known as:  novoLOG Inject 0-5 Units into the skin  at bedtime.   insulin detemir 100 UNIT/ML injection Commonly known as:  LEVEMIR Inject 0.36 mLs (36 Units total) into the skin at bedtime.   methocarbamol 500 MG tablet Commonly known as:  ROBAXIN Take 2 tablets (1,000 mg total) by mouth every 8 (eight) hours as needed for muscle spasms.   morphine 2 MG/ML injection Inject  1-2 mLs (2-4 mg total) into the vein every 2 (two) hours as needed (Breakthrough pain only).   naproxen 500 MG tablet Commonly known as:  NAPROSYN Take 1 tablet (500 mg total) by mouth 2 (two) times daily with a meal.   ondansetron 4 MG tablet Commonly known as:  ZOFRAN Take 1 tablet (4 mg total) by mouth every 6 (six) hours as needed for nausea.   polyethylene glycol packet Commonly known as:  MIRALAX / GLYCOLAX Take 17 g by mouth 2 (two) times daily.   traMADol 50 MG tablet Commonly known as:  ULTRAM Take 1-2 tablets (50-100 mg total) by mouth every 6 (six) hours as needed (50mg  for mild pain, 75mg  for moderate pain, 100mg  for severe pain).   warfarin 5 MG tablet Commonly known as:  COUMADIN Take 5-7.5 mg by mouth See admin instructions. 5 mg in the morning on Sun/Mon/Wed/Fri/Sat and 7.5 on Tues/Thurs          Signed: Edson Snowball, [facility] 11/01/2015, 10:19 AM Pager: [phone] Consults: [phone] Mon-Fri 7:00 am-4:30 pm Sat-Sun 7:00 am-11:30 am

## 2015-11-01 NOTE — Progress Notes (Signed)
Physical Therapy Treatment Patient Details Name: Derek Curtis MRN: [id number] DOB: 10/29/1940 Today's Date: 11/01/2015    History of Present Illness Pt is a 75 y.o. male admitted following a fall in which he sustained multi right side rib fxs with questionable hemothorax.  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    PT Comments    Pt making gradual progress with mobility, able to increase ambulation distance during session. Pt and family deny questions or concerns following session. PT to continue to follow and progress as tolerated.   Follow Up Recommendations  SNF;Supervision/Assistance - 24 hour     Equipment Recommendations  Rolling walker with 5" wheels    Recommendations for Other Services       Precautions / Restrictions Precautions Precautions: Fall Restrictions Weight Bearing Restrictions: No    Mobility  Bed Mobility               General bed mobility comments: up in chair upon arrival  Transfers Overall transfer level: Needs assistance Equipment used: Rolling walker (2 wheeled) Transfers: Sit to/from Stand Sit to Stand: +2 physical assistance;Mod assist         General transfer comment: Repeated X2, break taken upon standing to catch breath and gain stability upon initial stand.   Ambulation/Gait Ambulation/Gait assistance: Min guard;+2 safety/equipment (assist with IV pole and O2 tank.) Ambulation Distance (Feet): 70 Feet (repeat x2 with seated rest between) Assistive device: Rolling walker (2 wheeled) Gait Pattern/deviations: Decreased step length - right;Decreased step length - left;Decreased dorsiflexion - right Gait velocity: decreased   General Gait Details: With fatigue pt having decreased Rt LE clearance with swing phase. Cues to tend to Rt LE for safety. Pt declining use of gait belt. SpO2 >92% after ambulation, using 5LO2 while ambulating.    Stairs            Wheelchair Mobility     Modified Rankin (Stroke Patients Only)       Balance Overall balance assessment: Needs assistance Sitting-balance support: No upper extremity supported Sitting balance-Leahy Scale: Good     Standing balance support: Bilateral upper extremity supported Standing balance-Leahy Scale: Poor Standing balance comment: using rw                    Cognition Arousal/Alertness: Awake/alert Behavior During Therapy: WFL for tasks assessed/performed Overall Cognitive Status: Within Functional Limits for tasks assessed                      Exercises      General Comments General comments (skin integrity, edema, etc.): Seated rest break taken between ambulation attempts.       Pertinent Vitals/Pain Pain Assessment: Faces Faces Pain Scale: Hurts little more Pain Location: Rt chest Pain Descriptors / Indicators: Sharp;Guarding;Grimacing Pain Intervention(s): Limited activity within patient's tolerance;Monitored during session    Home Living                      Prior Function            PT Goals (current goals can now be found in the care plan section) Acute Rehab PT Goals Patient Stated Goal: be able to get back home as soon as possible PT Goal Formulation: With patient Time For Goal Achievement: 11/12/15 Potential to Achieve Goals: Good Progress towards PT goals: Progressing toward goals    Frequency  Min 5X/week    PT  Plan Current plan remains appropriate    Co-evaluation             End of Session Equipment Utilized During Treatment: Oxygen Activity Tolerance: Patient limited by fatigue Patient left: in chair;with call bell/phone within reach;with family/visitor present     Time: [id number] PT Time Calculation (min) (ACUTE ONLY): 19 min  Charges:  $Gait Training: 8-22 mins                    G Codes:      Christiane HaBenjamin J. Kellis Mcadam, PT, CSCS Pager [phone] Office [phone]  11/01/2015, 11:22 AM

## 2015-11-01 NOTE — Clinical Social Work Note (Signed)
CSW notified that pt medically cleared for discharge by RN.  CSW informed RN pt needs to have discharge orders and that once these are on pt chart they will be faxed to SNF. CSW spoke with pt wife via phone and informed that pt will be discharged once MD puts in discharge summary and discharge order. CSW contacted SNF to inform pt discharging from hospital today and that family will transport pt to SNF.

## 2015-11-01 NOTE — Progress Notes (Signed)
Social worker called twice and messages left but no call back received at this time. Wife says they are also going to need a walker at discharge.

## 2015-11-01 NOTE — Progress Notes (Signed)
Case manager called regarding pt's wife request for a walker and she talked to her and informed her that she will make arrangements for the walker to be brought to the pt's room

## 2015-11-01 NOTE — Progress Notes (Signed)
CM received call from RN requesting a wide rolling walker for trip to [place].  Cm notified [facility] DME rep, Reggie to please deliver wide rolling walker to room so pt can discharge.  No other CM needs were communicated.

## 2017-06-02 IMAGING — CT CT CHEST W/ CM
2 of 5 series · 14 of 36 positions shown, 17 images · IV contrast (Omni 300)
Comparison: Chest radiograph of 10/28/2015.

CLINICAL DATA: Chest wall pain after falling while walking.
Hypertension. COPD. Diabetes. Atrial fibrillation.

EXAM:
CT CHEST, ABDOMEN, AND PELVIS WITH CONTRAST
TECHNIQUE: Multidetector CT imaging of the chest, abdomen and pelvis was
performed following the standard protocol during bolus
administration of intravenous contrast.
CONTRAST:  100mL 1HID7T-NOO IOPAMIDOL (1HID7T-NOO) INJECTION 61%

[Series 2: cap with 5mm st · axial · 0.96mm/px · z∈[-87,+463]mm · 11 of 132 slices shown, 14 images]
[im 11/132  mediastinal]
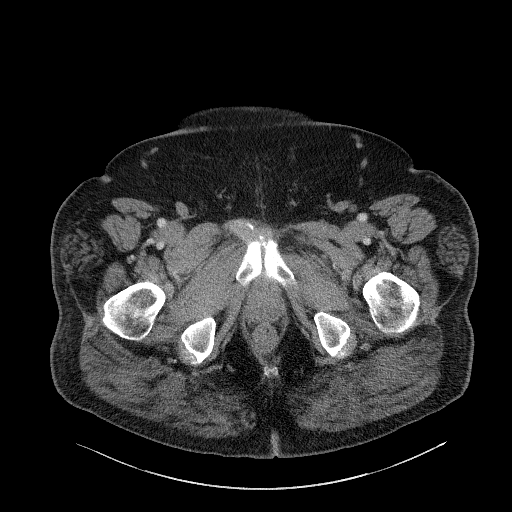
[im 11/132  lung]
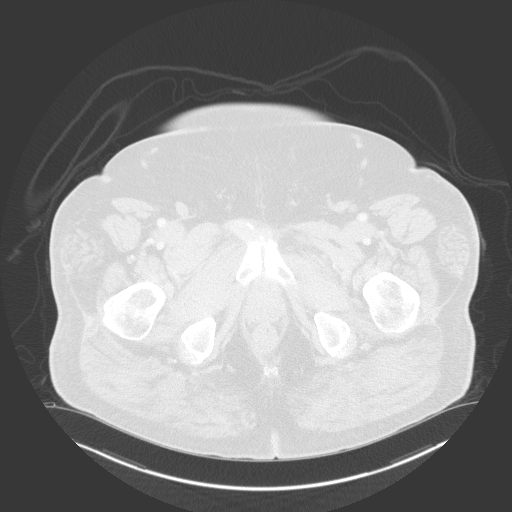
[im 22/132  lung]
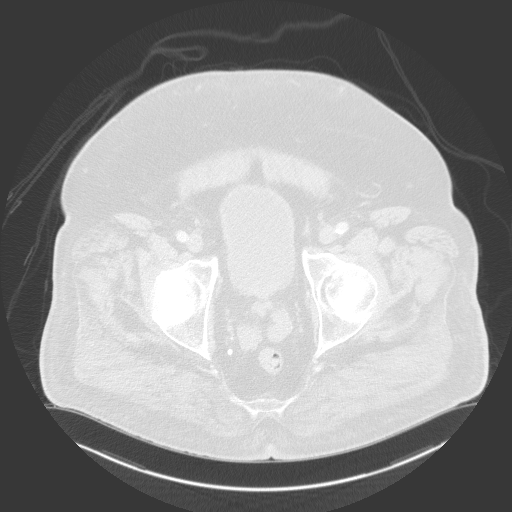
[im 33/132  lung]
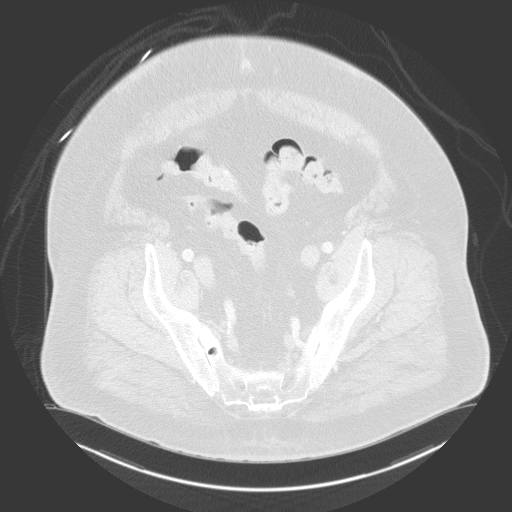
[im 44/132  lung]
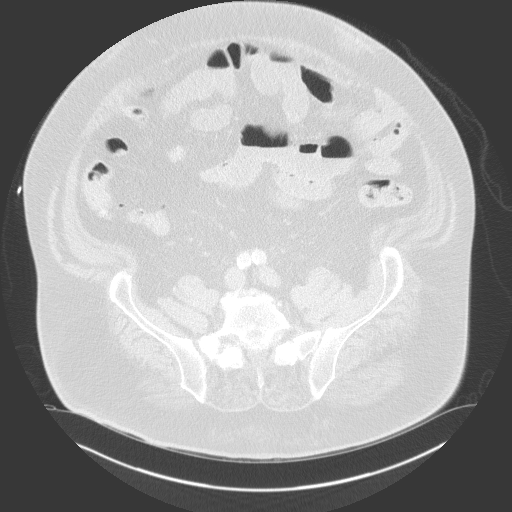
[im 55/132  mediastinal]
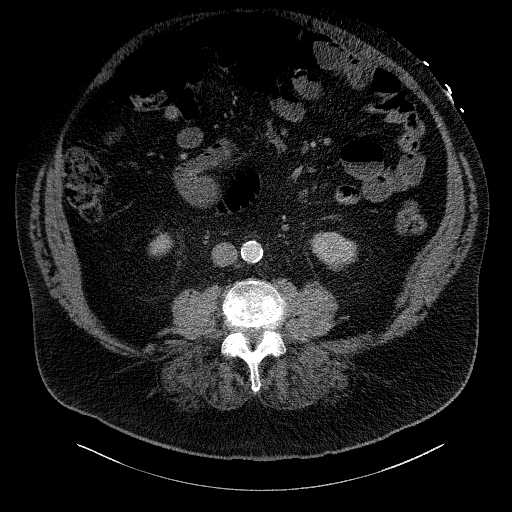
[im 55/132  lung]
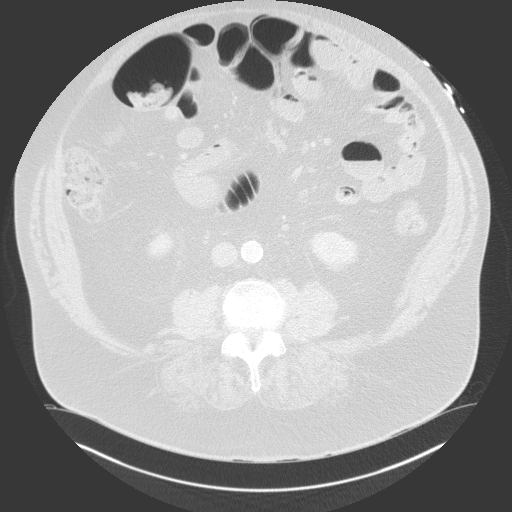
[im 66/132  lung]
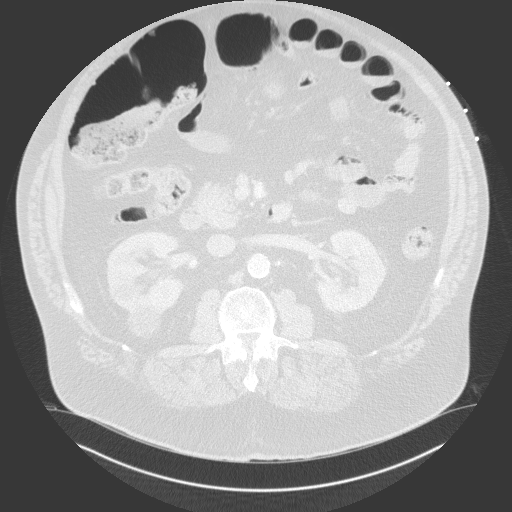
[im 77/132  lung]
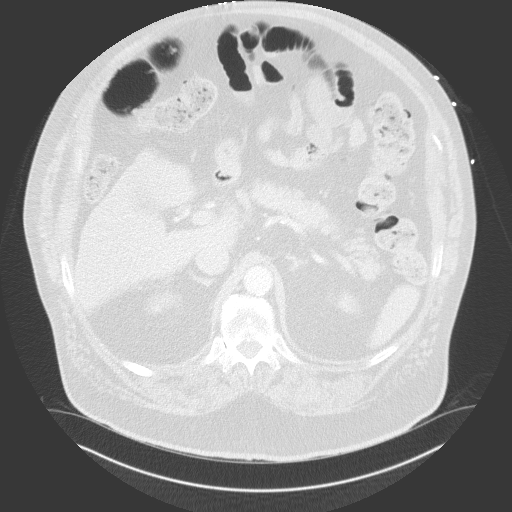
[im 88/132  lung]
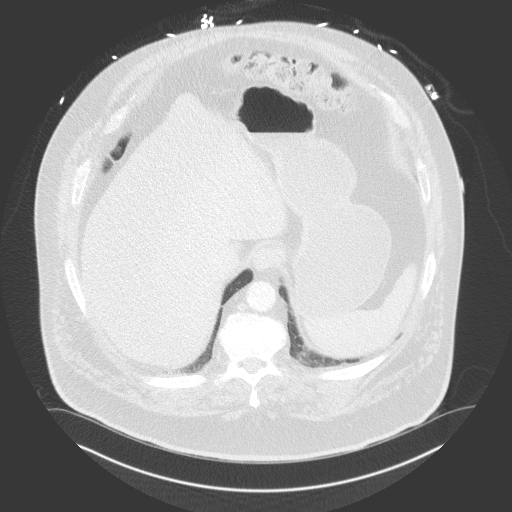
[im 99/132  mediastinal]
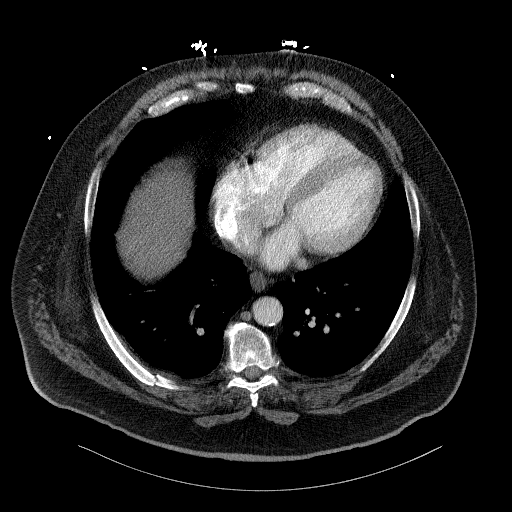
[im 99/132  lung]
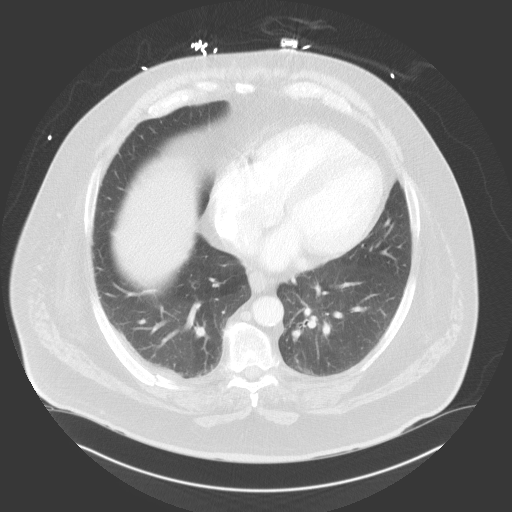
[im 110/132  lung]
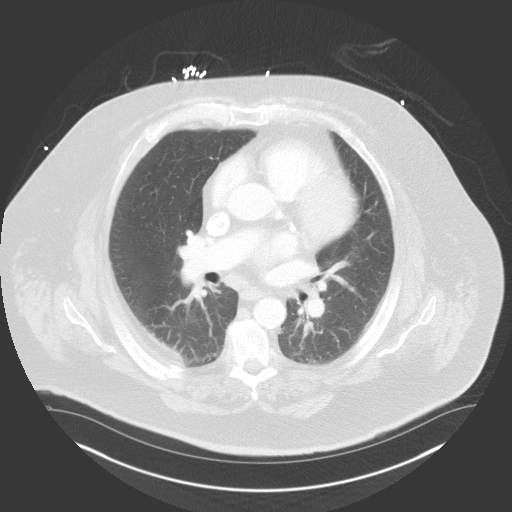
[im 121/132  lung]
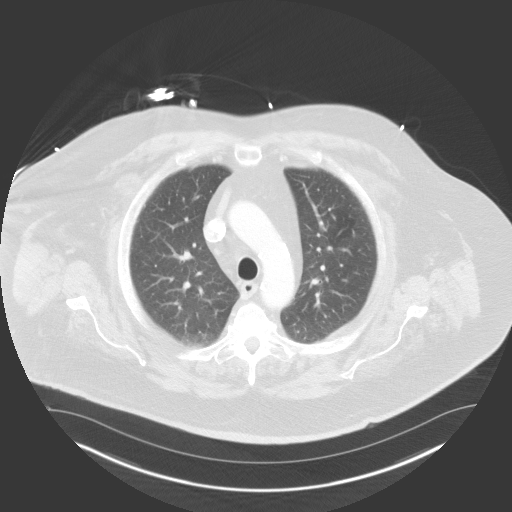

[Series 4: cap with 3mm st cor · coronal · 0.93mm/px · 3 of 219 slices shown]
[im 44/219  lung]
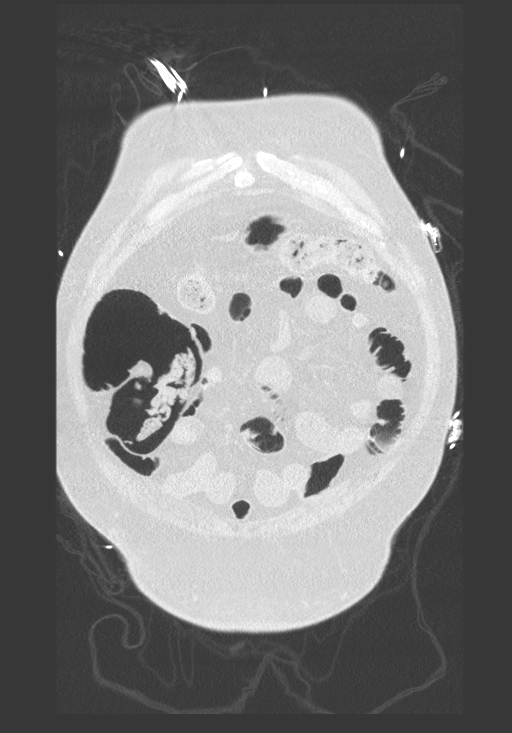
[im 88/219  lung]
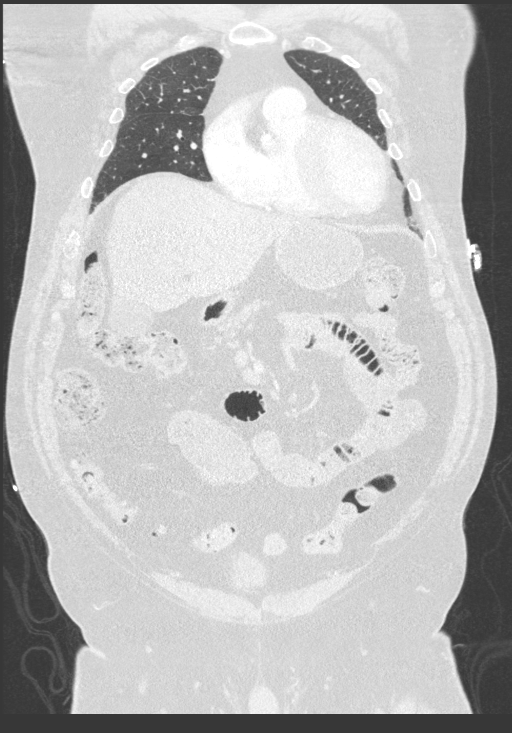
[im 131/219  lung]
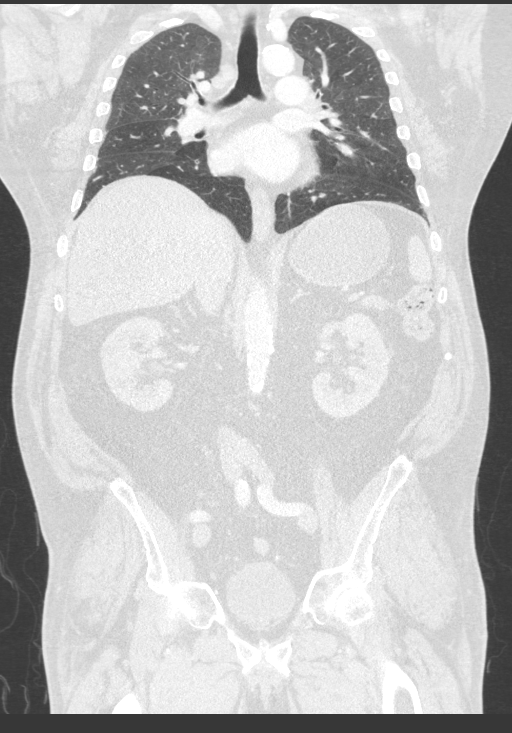

[14 of 36 positions shown; findings below may reference images not displayed]

FINDINGS: CT CHEST FINDINGS

Cardiovascular: Aortic and branch vessel atherosclerosis. Mild
cardiomegaly, without pericardial effusion. Multivessel coronary
artery atherosclerosis. Pulmonary artery enlargement, 3.7 cm outflow
tract.

Mediastinum/Nodes: No mediastinal or hilar adenopathy.

Lungs/Pleura: Trace right-sided pleural thickening. No pneumothorax.
Clear lungs.

Musculoskeletal: Minimally displaced fractures of the posterior
fourth through eighth ribs.

CT ABDOMEN PELVIS FINDINGS

Hepatobiliary: Nonspecific caudate lobe enlargement. No focal liver
lesion. Normal gallbladder, without biliary ductal dilatation.

Pancreas: Normal, without mass or ductal dilatation.

Spleen: Normal in size, without focal abnormality.

Adrenals/Urinary Tract: Normal adrenal glands. Posterior interpolar
right renal 4.5 cm lesion demonstrates minimal complexity within,
most consistent with a minimally complex cyst. Example image
64/series 2.

More inferior interpolar right renal lesion is fluid density, most
consistent with a cyst. There also too small to characterize lesions
in both kidneys No hydronephrosis. Normal urinary bladder.

Stomach/Bowel: Normal stomach, without wall thickening. Scattered
colonic diverticula. Normal small bowel.

Vascular/Lymphatic: Advanced aortic and branch vessel
atherosclerosis. No abdominopelvic adenopathy.

Reproductive: Normal prostate.

Other: No significant free fluid.  No free intraperitoneal air.

Musculoskeletal: No acute osseous abnormality. Thoracolumbar
spondylosis, with degenerate disc disease at L4-5.
IMPRESSION: 1. Right fourth through eighth posterior rib fractures, acute.
Minimal overlying right-sided pleural thickening. No pneumothorax.
2. No other posttraumatic deformity identified.
3. Mild  degradation secondary to patient body habitus.
4.  Coronary artery atherosclerosis. Aortic atherosclerosis.
5. Pulmonary artery enlargement suggests pulmonary arterial
hypertension.

## 2017-06-03 IMAGING — CR DG CHEST 1V PORT
1 series · 1 of 1 positions shown · non-contrast
Comparison: 10/28/2015

CLINICAL DATA: Short of breath

EXAM:
PORTABLE CHEST 1 VIEW

[AP]
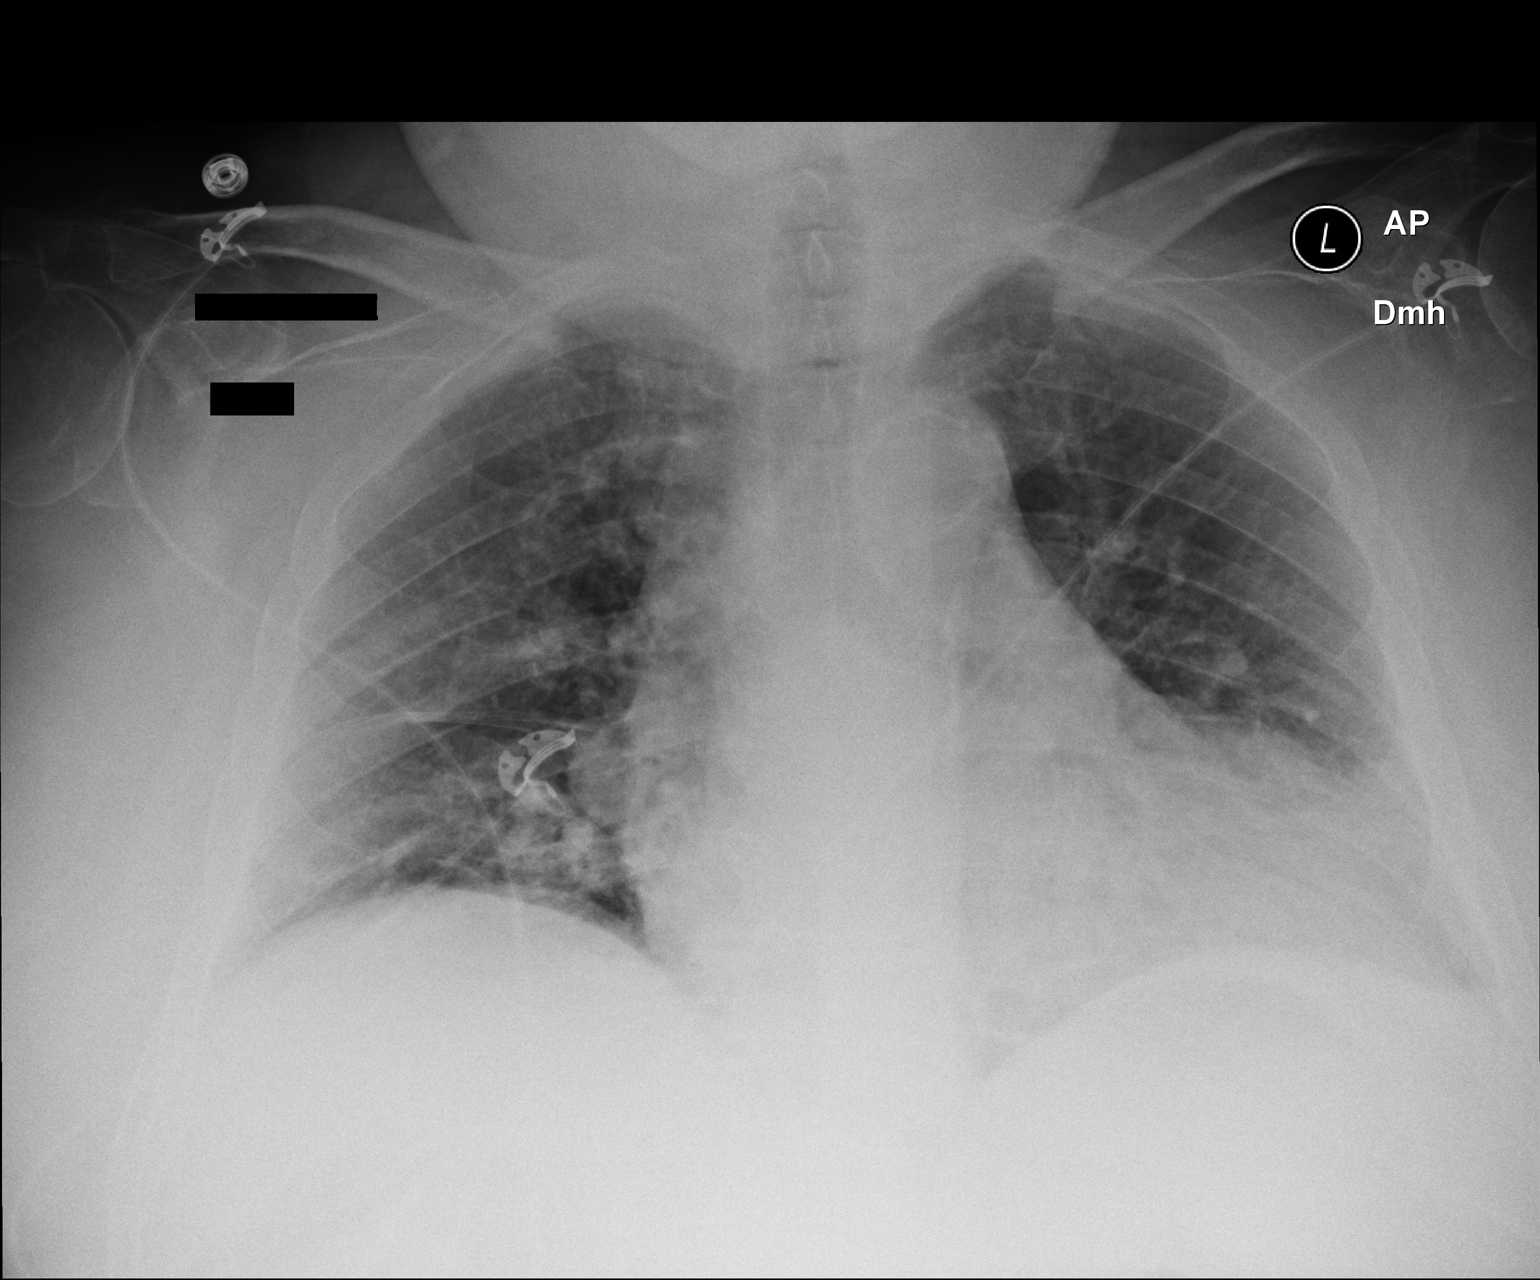

[1 of 1 positions shown; findings below may reference images not displayed]

FINDINGS: Improved aeration in the lung bases since the prior study. Decrease
in bibasilar airspace disease, probably atelectasis. Negative for
effusion or edema. Cardiac enlargement.
IMPRESSION: Improvement in aeration in the lung bases since yesterday.

## 2022-09-19 ENCOUNTER — Ambulatory Visit
Admission: RE | Admit: 2022-09-19 | Discharge: 2022-09-19 | Disposition: A | Discharge status: Home / Self Care | Payer: BLUE CROSS/BLUE SHIELD | Source: Ambulatory Visit | Attending: Physician Assistant | Admitting: Physician Assistant

## 2022-09-19 ENCOUNTER — Other Ambulatory Visit: Payer: Self-pay | Admitting: Physician Assistant

## 2022-09-19 DIAGNOSIS — R197 Diarrhea, unspecified: Secondary | ICD-10-CM
